# Patient Record
Sex: Female | Born: 1970 | Race: White | Hispanic: No | Marital: Married | Smoking: Never smoker
Health system: Southern US, Community
[De-identification: ages and names within clinical notes are randomized; demographics above are authoritative.]

## PROBLEM LIST (undated history)

## (undated) DIAGNOSIS — J9819 Other pulmonary collapse: Secondary | ICD-10-CM

## (undated) DIAGNOSIS — F419 Anxiety disorder, unspecified: Secondary | ICD-10-CM

## (undated) DIAGNOSIS — E559 Vitamin D deficiency, unspecified: Secondary | ICD-10-CM

## (undated) DIAGNOSIS — Z9889 Other specified postprocedural states: Secondary | ICD-10-CM

## (undated) DIAGNOSIS — G4700 Insomnia, unspecified: Secondary | ICD-10-CM

## (undated) DIAGNOSIS — Z309 Encounter for contraceptive management, unspecified: Secondary | ICD-10-CM

## (undated) DIAGNOSIS — F32A Depression, unspecified: Secondary | ICD-10-CM

## (undated) DIAGNOSIS — T4145XA Adverse effect of unspecified anesthetic, initial encounter: Secondary | ICD-10-CM

## (undated) DIAGNOSIS — Z8619 Personal history of other infectious and parasitic diseases: Secondary | ICD-10-CM

## (undated) DIAGNOSIS — M549 Dorsalgia, unspecified: Secondary | ICD-10-CM

## (undated) DIAGNOSIS — R112 Nausea with vomiting, unspecified: Secondary | ICD-10-CM

## (undated) DIAGNOSIS — G43909 Migraine, unspecified, not intractable, without status migrainosus: Secondary | ICD-10-CM

## (undated) DIAGNOSIS — G8929 Other chronic pain: Secondary | ICD-10-CM

## (undated) DIAGNOSIS — T8859XA Other complications of anesthesia, initial encounter: Secondary | ICD-10-CM

## (undated) DIAGNOSIS — F329 Major depressive disorder, single episode, unspecified: Secondary | ICD-10-CM

## (undated) DIAGNOSIS — K589 Irritable bowel syndrome without diarrhea: Secondary | ICD-10-CM

## (undated) HISTORY — PX: LUNG SURGERY: SHX703

## (undated) HISTORY — DX: Anxiety disorder, unspecified: F41.9

## (undated) HISTORY — DX: Insomnia, unspecified: G47.00

## (undated) HISTORY — DX: Migraine, unspecified, not intractable, without status migrainosus: G43.909

## (undated) HISTORY — DX: Major depressive disorder, single episode, unspecified: F32.9

## (undated) HISTORY — DX: Other chronic pain: G89.29

## (undated) HISTORY — DX: Vitamin D deficiency, unspecified: E55.9

## (undated) HISTORY — DX: Irritable bowel syndrome without diarrhea: K58.9

## (undated) HISTORY — DX: Encounter for contraceptive management, unspecified: Z30.9

## (undated) HISTORY — DX: Depression, unspecified: F32.A

## (undated) HISTORY — DX: Personal history of other infectious and parasitic diseases: Z86.19

## (undated) HISTORY — PX: BACK SURGERY: SHX140

## (undated) HISTORY — DX: Other pulmonary collapse: J98.19

## (undated) HISTORY — DX: Dorsalgia, unspecified: M54.9

---

## 2000-06-27 ENCOUNTER — Ambulatory Visit (HOSPITAL_COMMUNITY): Admission: RE | Admit: 2000-06-27 | Discharge: 2000-06-27 | Payer: Self-pay | Admitting: Neurosurgery

## 2000-06-27 ENCOUNTER — Encounter: Payer: Self-pay | Admitting: Neurosurgery

## 2000-07-02 ENCOUNTER — Ambulatory Visit (HOSPITAL_COMMUNITY): Admission: RE | Admit: 2000-07-02 | Discharge: 2000-07-02 | Payer: Self-pay | Admitting: Neurosurgery

## 2000-07-19 ENCOUNTER — Encounter: Payer: Self-pay | Admitting: Neurosurgery

## 2000-07-19 ENCOUNTER — Ambulatory Visit (HOSPITAL_COMMUNITY): Admission: RE | Admit: 2000-07-19 | Discharge: 2000-07-19 | Payer: Self-pay | Admitting: Neurosurgery

## 2000-08-02 ENCOUNTER — Ambulatory Visit (HOSPITAL_COMMUNITY): Admission: RE | Admit: 2000-08-02 | Discharge: 2000-08-02 | Payer: Self-pay | Admitting: Neurosurgery

## 2000-08-02 ENCOUNTER — Encounter: Payer: Self-pay | Admitting: Neurosurgery

## 2000-09-30 ENCOUNTER — Ambulatory Visit (HOSPITAL_COMMUNITY): Admission: RE | Admit: 2000-09-30 | Discharge: 2000-09-30 | Payer: Self-pay | Admitting: Neurosurgery

## 2000-09-30 ENCOUNTER — Encounter: Payer: Self-pay | Admitting: Neurosurgery

## 2001-08-20 ENCOUNTER — Other Ambulatory Visit: Admission: RE | Admit: 2001-08-20 | Discharge: 2001-08-20 | Payer: Self-pay | Admitting: Obstetrics and Gynecology

## 2001-08-25 ENCOUNTER — Encounter: Payer: Self-pay | Admitting: Neurosurgery

## 2001-08-25 ENCOUNTER — Encounter: Admission: RE | Admit: 2001-08-25 | Discharge: 2001-08-25 | Payer: Self-pay | Admitting: Neurosurgery

## 2001-09-08 ENCOUNTER — Encounter: Admission: RE | Admit: 2001-09-08 | Discharge: 2001-09-08 | Payer: Self-pay | Admitting: Neurosurgery

## 2001-09-08 ENCOUNTER — Encounter: Payer: Self-pay | Admitting: Neurosurgery

## 2002-03-05 ENCOUNTER — Encounter: Payer: Self-pay | Admitting: Neurosurgery

## 2002-03-06 ENCOUNTER — Encounter: Payer: Self-pay | Admitting: Neurosurgery

## 2002-03-06 ENCOUNTER — Ambulatory Visit (HOSPITAL_COMMUNITY): Admission: RE | Admit: 2002-03-06 | Discharge: 2002-03-07 | Payer: Self-pay | Admitting: Neurosurgery

## 2002-08-14 ENCOUNTER — Encounter: Payer: Self-pay | Admitting: Obstetrics and Gynecology

## 2002-08-14 ENCOUNTER — Ambulatory Visit (HOSPITAL_COMMUNITY): Admission: RE | Admit: 2002-08-14 | Discharge: 2002-08-14 | Payer: Self-pay | Admitting: Obstetrics and Gynecology

## 2002-08-25 ENCOUNTER — Ambulatory Visit (HOSPITAL_COMMUNITY): Admission: RE | Admit: 2002-08-25 | Discharge: 2002-08-25 | Payer: Self-pay | Admitting: Neurosurgery

## 2002-08-25 ENCOUNTER — Encounter: Payer: Self-pay | Admitting: Neurosurgery

## 2003-02-06 ENCOUNTER — Ambulatory Visit (HOSPITAL_COMMUNITY): Admission: AD | Admit: 2003-02-06 | Discharge: 2003-02-06 | Payer: Self-pay | Admitting: Obstetrics and Gynecology

## 2003-03-09 ENCOUNTER — Ambulatory Visit (HOSPITAL_COMMUNITY): Admission: AD | Admit: 2003-03-09 | Discharge: 2003-03-09 | Payer: Self-pay | Admitting: Obstetrics and Gynecology

## 2003-05-11 ENCOUNTER — Ambulatory Visit (HOSPITAL_COMMUNITY): Admission: AD | Admit: 2003-05-11 | Discharge: 2003-05-11 | Payer: Self-pay | Admitting: Obstetrics and Gynecology

## 2003-05-13 ENCOUNTER — Ambulatory Visit (HOSPITAL_COMMUNITY): Admission: AD | Admit: 2003-05-13 | Discharge: 2003-05-13 | Payer: Self-pay | Admitting: Obstetrics & Gynecology

## 2003-05-20 ENCOUNTER — Ambulatory Visit (HOSPITAL_COMMUNITY): Admission: AD | Admit: 2003-05-20 | Discharge: 2003-05-20 | Payer: Self-pay | Admitting: Internal Medicine

## 2003-05-25 ENCOUNTER — Inpatient Hospital Stay (HOSPITAL_COMMUNITY): Admission: AD | Admit: 2003-05-25 | Discharge: 2003-05-27 | Payer: Self-pay | Admitting: Obstetrics and Gynecology

## 2003-07-19 ENCOUNTER — Encounter: Payer: Self-pay | Admitting: Neurology

## 2003-07-19 ENCOUNTER — Encounter: Admission: RE | Admit: 2003-07-19 | Discharge: 2003-07-19 | Payer: Self-pay | Admitting: Neurology

## 2004-04-19 ENCOUNTER — Encounter: Admission: RE | Admit: 2004-04-19 | Discharge: 2004-04-19 | Payer: Self-pay | Admitting: Neurosurgery

## 2004-05-04 ENCOUNTER — Encounter: Admission: RE | Admit: 2004-05-04 | Discharge: 2004-05-04 | Payer: Self-pay | Admitting: Neurosurgery

## 2004-05-30 ENCOUNTER — Encounter: Admission: RE | Admit: 2004-05-30 | Discharge: 2004-05-30 | Payer: Self-pay | Admitting: Neurosurgery

## 2004-07-07 ENCOUNTER — Inpatient Hospital Stay (HOSPITAL_COMMUNITY): Admission: RE | Admit: 2004-07-07 | Discharge: 2004-07-09 | Payer: Self-pay | Admitting: Neurosurgery

## 2004-08-21 ENCOUNTER — Encounter: Admission: RE | Admit: 2004-08-21 | Discharge: 2004-08-21 | Payer: Self-pay | Admitting: Neurosurgery

## 2004-10-02 ENCOUNTER — Encounter: Admission: RE | Admit: 2004-10-02 | Discharge: 2004-10-02 | Payer: Self-pay | Admitting: Neurosurgery

## 2005-11-05 HISTORY — PX: CHOLECYSTECTOMY: SHX55

## 2007-12-05 ENCOUNTER — Other Ambulatory Visit: Admission: RE | Admit: 2007-12-05 | Discharge: 2007-12-05 | Payer: Self-pay | Admitting: Obstetrics and Gynecology

## 2008-11-24 ENCOUNTER — Other Ambulatory Visit: Admission: RE | Admit: 2008-11-24 | Discharge: 2008-11-24 | Payer: Self-pay | Admitting: Obstetrics and Gynecology

## 2009-08-27 ENCOUNTER — Inpatient Hospital Stay (HOSPITAL_COMMUNITY): Admission: EM | Admit: 2009-08-27 | Discharge: 2009-08-31 | Payer: Self-pay | Admitting: Emergency Medicine

## 2009-08-29 ENCOUNTER — Encounter (INDEPENDENT_AMBULATORY_CARE_PROVIDER_SITE_OTHER): Payer: Self-pay | Admitting: General Surgery

## 2009-12-15 ENCOUNTER — Other Ambulatory Visit: Admission: RE | Admit: 2009-12-15 | Discharge: 2009-12-15 | Payer: Self-pay | Admitting: Obstetrics and Gynecology

## 2010-11-25 ENCOUNTER — Encounter: Payer: Self-pay | Admitting: Obstetrics and Gynecology

## 2010-11-26 ENCOUNTER — Encounter: Payer: Self-pay | Admitting: Obstetrics & Gynecology

## 2011-02-08 LAB — HEPATIC FUNCTION PANEL
ALT: 44 U/L — ABNORMAL HIGH (ref 0–35)
AST: 44 U/L — ABNORMAL HIGH (ref 0–37)
Albumin: 3 g/dL — ABNORMAL LOW (ref 3.5–5.2)
Albumin: 3.2 g/dL — ABNORMAL LOW (ref 3.5–5.2)
Alkaline Phosphatase: 60 U/L (ref 39–117)
Bilirubin, Direct: 1.8 mg/dL — ABNORMAL HIGH (ref 0.0–0.3)
Indirect Bilirubin: 1.5 mg/dL — ABNORMAL HIGH (ref 0.3–0.9)
Total Bilirubin: 0.5 mg/dL (ref 0.3–1.2)
Total Bilirubin: 0.9 mg/dL (ref 0.3–1.2)
Total Bilirubin: 3.3 mg/dL — ABNORMAL HIGH (ref 0.3–1.2)
Total Protein: 5.5 g/dL — ABNORMAL LOW (ref 6.0–8.3)
Total Protein: 5.5 g/dL — ABNORMAL LOW (ref 6.0–8.3)

## 2011-02-08 LAB — BASIC METABOLIC PANEL
CO2: 26 mEq/L (ref 19–32)
Chloride: 110 mEq/L (ref 96–112)
Creatinine, Ser: 0.73 mg/dL (ref 0.4–1.2)
GFR calc Af Amer: >60 mL/min (ref >60)
Glucose, Bld: 108 mg/dL — ABNORMAL HIGH (ref 70–99)
Sodium: 140 mEq/L (ref 135–145)

## 2011-02-08 LAB — COMPREHENSIVE METABOLIC PANEL
ALT: 18 U/L (ref 0–35)
AST: 29 U/L (ref 0–37)
Alkaline Phosphatase: 53 U/L (ref 39–117)
BUN: 10 mg/dL (ref 6–23)
GFR calc Af Amer: >60 mL/min (ref >60)
Total Bilirubin: 0.5 mg/dL (ref 0.3–1.2)

## 2011-02-08 LAB — POCT CARDIAC MARKERS: Troponin i, poc: <0.05 ng/mL (ref 0.00–0.09)

## 2011-02-08 LAB — DIFFERENTIAL
Basophils Absolute: 0 10*3/uL (ref 0.0–0.1)
Basophils Absolute: 0 10*3/uL (ref 0.0–0.1)
Basophils Relative: 0 % (ref 0–1)
Basophils Relative: 0 % (ref 0–1)
Basophils Relative: 0 % (ref 0–1)
Eosinophils Absolute: 0 10*3/uL (ref 0.0–0.7)
Eosinophils Absolute: 0.1 10*3/uL (ref 0.0–0.7)
Eosinophils Absolute: 0.1 10*3/uL (ref 0.0–0.7)
Eosinophils Relative: 0 % (ref 0–5)
Eosinophils Relative: 1 % (ref 0–5)
Lymphocytes Relative: 11 % — ABNORMAL LOW (ref 12–46)
Lymphocytes Relative: 31 % (ref 12–46)
Lymphs Abs: 2.1 10*3/uL (ref 0.7–4.0)
Monocytes Absolute: 0.6 10*3/uL (ref 0.1–1.0)
Monocytes Absolute: 0.6 10*3/uL (ref 0.1–1.0)
Monocytes Relative: 3 % (ref 3–12)
Monocytes Relative: 6 % (ref 3–12)
Neutro Abs: 16 10*3/uL — ABNORMAL HIGH (ref 1.7–7.7)
Neutrophils Relative %: 85 % — ABNORMAL HIGH (ref 43–77)

## 2011-02-08 LAB — CBC
HCT: 35.7 % — ABNORMAL LOW (ref 36.0–46.0)
HCT: 35.8 % — ABNORMAL LOW (ref 36.0–46.0)
Hemoglobin: 11.9 g/dL — ABNORMAL LOW (ref 12.0–15.0)
Hemoglobin: 12 g/dL (ref 12.0–15.0)
MCHC: 33.6 g/dL (ref 30.0–36.0)
MCHC: 34 g/dL (ref 30.0–36.0)
MCV: 91.8 fL (ref 78.0–100.0)
MCV: 92.4 fL (ref 78.0–100.0)
MCV: 93.2 fL (ref 78.0–100.0)
Platelets: 224 10*3/uL (ref 150–400)
Platelets: 232 10*3/uL (ref 150–400)
RBC: 3.8 MIL/uL — ABNORMAL LOW (ref 3.87–5.11)
RDW: 13 % (ref 11.5–15.5)

## 2011-02-08 LAB — URINE MICROSCOPIC-ADD ON

## 2011-02-08 LAB — LIPASE, BLOOD
Lipase: 33 U/L (ref 11–59)
Lipase: 36 U/L (ref 11–59)

## 2011-02-08 LAB — URINALYSIS, ROUTINE W REFLEX MICROSCOPIC
Bilirubin Urine: NEGATIVE
Ketones, ur: NEGATIVE mg/dL
Leukocytes, UA: NEGATIVE
Nitrite: NEGATIVE
Protein, ur: NEGATIVE mg/dL
Urobilinogen, UA: 0.2 mg/dL (ref 0.0–1.0)

## 2011-02-08 LAB — PREGNANCY, URINE: Preg Test, Ur: NEGATIVE

## 2011-03-23 NOTE — H&P (Signed)
[place]. [facility]  Patient:    [name], [name] Visit Number: [id number] MRN: [id number]          Service Type: DSU Location: 3000 [id number] [id number] Attending Physician:  Coletta Memos Dictated by:   Mena Goes. Franky Macho, M.D. Admit Date:  03/06/2002                           History and Physical  ADMITTING DIAGNOSES: 1. 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 2. Right L5 radiculopathy.  INDICATIONS:  [name] is a patient whom I have followed for a number of years.  I have been following her for what was a small disk at L4-5 on the right side which I never felt was big enough to justify doing a procedure.  I first saw her in May of 1999 and she had had back and right lower extremity pain at that time.  During the ensuing years, she has been treated with oral medications, conservative treatment, epidural steroids.  She has been able to return to work at times and at other times, she has had to come off of work. 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 The last time that I had seen [name] was on August 15th of 2001. [name] then was put on a p.r.n. basis and was doing well, until she called in late April.  She stated that she had severe pain in her back and right lower extremity which was different than it had been previously.  The pain was located on the right side, as it had been before, but its character and severity were different.  PAST MEDICAL HISTORY:  Excellent.  She denied constitutional, ear, nose, throat, cardiovascular, respiratory, genitourinary, skin, neurological, psychiatric, endocrinological, hematologic or allergic problems.  She has had pneumothoraces, which were spontaneous, in 1991, 1992 and 1996.  ALLERGIES:  She had intolerance to CODEINE.  SOCIAL HISTORY:  She does not smoke.  PHYSICAL EXAMINATION:  GENERAL:  [name], on examination, was alert and oriented x4 and answering all questions  appropriately, mild distress.  NEUROLOGIC:  Positive straight leg raising on the right.  Deep tendon reflexes 2+ at the knees and ankles and no clonus.  She was able to toe walk and heel walk.  Pupils are equal, round and reactive to light.  Full extraocular movements.  Tongue and uvula midline.  Shoulder shrug normal.  She had symmetric facial movement, symmetric facial sensation.  Hearing intact to finger rub bilaterally.  She has no drift on exam.  Gait was mildly antalgic and favoring the right lower extremity.  NECK:  No cervical masses or bruits.  LUNGS:  Clear.  HEART:  Regular rhythm and rate.  No murmurs or rubs.  LABORATORY AND ACCESSORY DATA:  MRI shows a displaced disk at L4-5 which had enlarged since the last time it had been imaged.  ASSESSMENT AND PLAN:  I recommended to [name] that she undergo operative decompression, since she had been living with the pain for so long, had had the disk previously and it was degenerated, so there was no real reason for her not to simply undergo the operation as conservative treatment certainly had been given more than enough time. Dictated by:   Mena Goes. Franky Macho, M.D. Attending Physician:  Coletta Memos DD:  03/06/02 TD:  03/07/02 Job: [id number]

## 2011-03-23 NOTE — Discharge Summary (Signed)
NAME:  [name], [name]                      ACCOUNT NO.:  [id number]   MEDICAL RECORD NO.:  [id number]                   PATIENT TYPE:  INP   LOCATION:  A417                                 FACILITY:  [facility]   PHYSICIAN:  Tilda Burrow, M.D.              DATE OF BIRTH:  Jan 21, 1971   DATE OF ADMISSION:  05/25/2003  DATE OF DISCHARGE:  05/27/2003                                 DISCHARGE SUMMARY   ADMISSION DIAGNOSES:  1. Pregnancy, [redacted] week gestation.  2. Elective induction due to extreme pelvic discomfort and patient anxiety.   DISCHARGE DIAGNOSES:  1. Pregnancy, [redacted] weeks gestation, delivered.  2. Elective induction.  3. Transient tachypnea of the newborn, dictated elsewhere.   PROCEDURE:  1. Foley bulb cervical ripening on May 25, 2003.  2. Spontaneous vertex vaginal delivery on May 26, 2003 by Zerita Boers.   DISCHARGE MEDICATIONS:  1. Prenatal vitamins.  2. Iron.  3. Xanax.  4. Wellbutrin.  5. Tylox.   HOSPITAL COURSE:  This is a 40 year old female who is a Gravida II, Para 1  at 38 weeks by consensus criteria who was admitted for elective induction as  per patient's vigorous wishes.  A lengthy discussion of the pros and cons of  induction had been discussed and were specifically acknowledged by a small  (1 to 2%) potential for issues of prematurity due to the induction at 38  weeks.  The patient had been offered induction the next Sunday night and  Monday and had this recommended as a prior option, but felt she could no  longer endure subsequent pregnancy and was worried about the baby.   Her prenatal course had been notable for a relatively straightforward course  with marked patient discomfort over the past month particularly.  The cervix  was 3 cm, 90% effaced and +1 station for quite some time.   The patient was admitted and cervical balloon placed and the patient  responded nicely following which she was begun on Pitocin and amniotomy  performed. She  progressed nicely to completely dilated and delivered under  the delivery of Zerita Boers, CNM.  Delivery was uneventful, but the baby  promptly showed some respiratory difficulties.  Apgars assigned were 7 and 8  with the infant showing a spontaneous cry and with the appropriate  suctioning performed at delivery.  The infant's color did not improve as  much as expected and remained cyanotic despite stimulation and otherwise  thorough nursing and physician care. The baby was transferred to the nursery  for further monitoring and oxygen care.  The patient's course was uneventful  except as pertaining to the infant.  The infant showed a definite need for  additional care. A chest x-ray showed relatively clear lung fields.  The  infant was transferred out of concern for its respiratory effort to [facility].  See copies of records from that hospital for further details.  The infant's  condition at the time of this discharge summary dictation per  verbal reports indicate that the baby has done well. The baby was discharged  in 48 hours.   Pathology report returned showing a 485 gram uterus, considered mature by  pathology criteria.  Postoperatively the patient has a hemoglobin of 9.7 and  a hematocrit of 29.2 one day postpartum.  The patient was then discharged  home for follow-up through our office as needed and then at four weeks  postpartum for consideration of permanent sterilization.   Addendum:  The patient is encouraging partner to have a vasectomy as her  preferred long term contraception.                                               Tilda Burrow, M.D.    JVF/MEDQ  D:  06/06/2003  T:  06/07/2003  Job:  [id number]

## 2011-03-23 NOTE — Op Note (Signed)
   NAME:  [name], [name]                      ACCOUNT NO.:  [id number]   MEDICAL RECORD NO.:  [id number]                   PATIENT TYPE:  INP   LOCATION:  A417                                 FACILITY:  [facility]   PHYSICIAN:  Tilda Burrow, M.D.              DATE OF BIRTH:  04/12/1971   DATE OF PROCEDURE:  05/26/2003  DATE OF DISCHARGE:                                  PROCEDURE NOTE   PROCEDURE:   SURGEON:  Tilda Burrow, M.D.   DETAILS OF PROCEDURE:  The patient was placed in sitting position after exam  showed her to be 7 cm dilated with cervix dense tightly applied to the head.  There is quite a bit of progress to be made because of the firm nature of  the tissue. A continuous lumbar epidural catheter was placed using loss of  resistance technique at L2-3 interspace.  We moved up from L3-4 due to bony  fibrosis encountered at L3-4. She has had an L4-5 fusion so lower spaces  were technically challenging.   The patient had easy identification of the epidural space at L2-3 on first  attempt with the 19-gauge Touhy needle using loss of resistance technique.  Aspiration showed no evidence of intrathecal position.  The 5 cc bolus of  1.5% Xylocaine with epinephrine was infused, then epidural catheter,  followed by once again aspiration test and then bolus of 5 cc of 0.125%  Marcaine.  We then proceed with 8 cc/hour of continuous infusion.  The  patient had satisfactory analgesic relief.  Vaginal delivery is anticipated.                                               Tilda Burrow, M.D.    JVF/MEDQ  D:  05/26/2003  T:  05/26/2003  Job:  [id number]

## 2011-03-23 NOTE — H&P (Signed)
NAME:  [name], [name]                      ACCOUNT NO.:  [id number]   MEDICAL RECORD NO.:  [id number]                   PATIENT TYPE:  INP   LOCATION:  LDR4                                 FACILITY:  [facility]   PHYSICIAN:  Tilda Burrow, M.D.              DATE OF BIRTH:  April 13, 1971   DATE OF ADMISSION:  05/25/2003  DATE OF DISCHARGE:                                HISTORY & PHYSICAL   ADMISSION DIAGNOSES:  1. Pregnancy at [redacted] weeks gestation.  2. Elective induction due to extreme pelvic discomfort and patient anxiety.   HISTORY OF PRESENT ILLNESS:  This 40 year old female, gravida 2, para 1, AB  0, LMP ? October 2003, placing unreliable menstrual EDC with ultrasound  assigning EDC of 06/08/2003 based on 20-week ultrasound.  The patient is 38  weeks by that criteria.  First trimester ultrasound at 7 weeks suggested  06/17/2003 EDC.  A 30-week ultrasound showed 06/13/2003.  The patient has had  an extremely miserable last month of pregnancy, has had marked cervical  favorability, and very low presenting part over the last month.  Cervical  exam suggested a possible 1 station over the last four exams.  Cervix was 3  cm, 90%, +1 by 05/19/2003 exam.  My exam shows her to be 3 cm, 50%, 0 to +1  station presenting part with a posterior oriented cervix on 05/24/2003.  Lengthy discussion of pros and cons of induction has been conducted with the  patient, specifically acknowledging the small (1 to 2%) but unavoidable  potential for issues of prematurity due to the induction at 38 weeks.  The  patient has been offered induction next Sunday night and Monday and declines  this offer.  The patient has had thorough counseling from multiple family  members and staff regarding this issues.   PAST MEDICAL HISTORY:  Bulging disk times many years.   PAST SURGICAL HISTORY:  1. Back surgery L4-5 fusion.  2. Chest tubes placed due to collapsed lungs.   ALLERGIES:  CODEINE.   MEDICATIONS:  Wellbutrin  and Tylox during early pregnancy, discontinued, and  Prozac has been continued since that time due to patient's anxiety disorder.   SOCIAL HISTORY:  Married, unemployed.   PHYSICAL EXAMINATION:  GENERAL:  Healthy, slim, Caucasian female, alert and  oriented x 3.  HEENT:  Pupils are equal, round, and reactive.  Extraocular movements  intact.  NECK:  Supple. Trachea midline.  CARDIOVASCULAR:  Exam unremarkable.  PELVIC:  Cervix 7 cm, 90%, 0 to 1+ station.    PRENATAL LABORATORY DATA:  Blood type O negative, rubella immune at the  present.  Hemoglobin 13, hematocrit 39.  Hepatitis, HIV, GC, Chlamydia all  negative.  Group B strep negative.   PLAN:  Continue induction of labor.  Good prognosis for vaginal delivery.  Tilda Burrow, M.D.    JVF/MEDQ  D:  05/26/2003  T:  05/26/2003  Job:  [id number]   cc:   Dr. Alphia Moh pediatrician

## 2011-03-23 NOTE — Op Note (Signed)
[place]. [facility]  Patient:    [name], [name] Visit Number: [id number] MRN: [id number]          Service Type: DSU Location: 3000 3041 01 Attending Physician:  Coletta Memos Dictated by:   Mena Goes. Franky Macho, M.D. Proc. Date: 03/06/02 Admit Date:  03/06/2002 Discharge Date: 03/07/2002                             Operative Report  PREOPERATIVE DIAGNOSES: 1. 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 2. Right L5 radiculopathy.  POSTOPERATIVE DIAGNOSES: 1. 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 2. Right L5 radiculopathy.  PROCEDURE:  Right L4 semihemilaminectomy with diskectomy, with microdissection.  COMPLICATIONS:  None.  SURGEON: Kyle L. Franky Macho, M.D.  ANESTHESIA:  General endotracheal.  INDICATION:  [name] is a woman who presents with severe pain in the right lower extremity and a displaced disk on the right side at L4-5.  With conservative treatment not helping, I recommended and she agreed to undergo laminectomy and diskectomy.  DESCRIPTION OF PROCEDURE:  [name] was brought to the operating room, intubated, and placed under general anesthesia without difficulty.  She was rolled prone onto the Wilson frame, ensuring all pressure points were properly padded.  A spinal needle was placed for localization, and it was in between the spinous processes of L4 and L5.  Using that as a guide, the patient was then prepped.  I then infiltrated 0.5% lidocaine and 1:200,000 epinephrine 10 cc into the paraspinous musculature on the left side.  I opened the incision with a #10 blade and took this down to the thoracolumbar fascia sharply.  I made a semicircular opening in the thoracolumbar fascia and in a subperiosteal fashion exposed the laminae of what I believed to be L4 and L5. I took another x-ray after placing a double-ended ganglion knife inferior to the superior lamina, which was L4.  Once identifying L4, I then proceeded with a semihemilaminectomy using a  high-speed air drill on L4.  I was able to then detach the ligamentum flavum from L4.  I brought the microscope into the operative field and removed the ligamentum flavum in a rostral to caudal direction, removing the thecal sac.  I controlled some epidural bleeding with bipolar cautery and division of the veins.  I retracted that medially and identified what was a rather large disk herniation.  I opened that with a #15 blade, and disk material extruded under its own pressure.  I then removed this using pituitary rongeurs.  I removed the loose material which I was able to get from the disk space.  I then irrigated the wound.  I inspected, and there was no pressure on the L5 nerve root.  I then irrigated again and then closed the wound in layered fashion using Vicryl sutures to reapproximate the fascial flap and subcutaneous tissues.  Dermabond used for a sterile dressing. Dictated by:   Mena Goes. Franky Macho, M.D. Attending Physician:  Coletta Memos DD:  03/06/02 TD:  03/09/02 Job: [id number]

## 2011-03-23 NOTE — Op Note (Signed)
   NAME:  [name], [name]                      ACCOUNT NO.:  [id number]   MEDICAL RECORD NO.:  [id number]                   PATIENT TYPE:  INP   LOCATION:  LDR4                                 FACILITY:  [facility]   PHYSICIAN:  Tilda Burrow, M.D.              DATE OF BIRTH:  11/18/1970   DATE OF PROCEDURE:  05/26/2003  DATE OF DISCHARGE:                                  PROCEDURE NOTE   DELIVERY SUMMARY:  Onset of labor is May 26, 2003, at 8 a.m.  Date of delivery:  May 26, 2003, at 9:12 a.m.  Length of first stage labor:  One hour.  Length of second stage labor:  12 minutes.  Length of third stage labor:  8 minutes.   DELIVERY NOTE:  [name] had a spontaneous delivery of a viable female  infant.  Upon delivery of infant, sterile suction was done.  Good tone.  Difficulty to get the infant pinking up.  The infant had grunting upon  birth.  The infant was dried and placed in the care of the nurses, who  proceeded to administer blow-by to the infant.  Apgars were 8 and 8.  Upon  inspection there was a second degree perineal laceration, which was repaired  with three interrupted sutures of 2-0 Vicryl.  Third stage of labor was  actively managed with 20 units of Pitocin in 1000 mL of D5LR at a rapid  rate.  The placenta was delivered spontaneously via Tomasa Blase mechanism.  Cord  gas and cord blood were obtained and sent to the lab.  Estimated blood loss  was approximately 350 mL.  The infant was taken to the special care nursery  and Francoise Schaumann. Halm, D.O., notified to administer care.  Mother was  stabilized and transferred out to the postpartum unit.  The infant was taken  to the special care nursery for attendance.     Zerita Boers, Reita Cliche, M.D.    DL/MEDQ  D:  16/08/9603  T:  05/26/2003  Job:  [id number]

## 2011-03-23 NOTE — H&P (Signed)
NAME:  [name], [name]                      ACCOUNT NO.:  [id number]   MEDICAL RECORD NO.:  [id number]                    PATIENT TYPE:   LOCATION:                                       FACILITY:   PHYSICIAN:  Tilda Burrow, M.D.              DATE OF BIRTH:   DATE OF ADMISSION:  05/25/2003  DATE OF DISCHARGE:                                HISTORY & PHYSICAL   ADMISSION DIAGNOSIS:  Pregnancy at 59 weeks' gestation, elective induction  of labor.   HISTORY OF PRESENT ILLNESS:  This 40 year old female, gravida 2, para 1, AB  0, LMP ? with early ultrasound-assigned [facility] of June 08, 2003, was admitted  at 38 weeks by that ultrasound criteria after a pregnancy course followed  through our office and notable over the past six weeks for recurrent  episodes of preterm uterine irritability and tons of pressure symptoms over  the past few weeks.  Clancy has been requesting induction for a couple of  this weeks.  At this point we have given her the option of induction next  Monday or Wednesday, May 26, 2003, with a lengthy 20-minute discussion of  the pros and cons of induction, with specific emphatic emphasis that she  acknowledge that there is some slight inevitable risk of problems with any  induction, particularly at 38 weeks.  The patient understands the potential  for the baby delivered without complications is quite high; however, there  is some inherent small, less than 2% risk of complications such as need for  emergency delivery or lung prematurity that are unavoidable with induction.  The patient has thought it over, discussed with family, and after a time  accepts this and after all this informed information requests induction on  May 26, 2003.  She will be admitted on July 20 for cervical ripening.   PAST MEDICAL HISTORY:  A bulging disk in the back, history of collapsed  lung.   PAST SURGICAL HISTORY:  Back surgery in the past.   ALLERGIES:  CODEINE.   MEDICATIONS:  Wellbutrin, Prozac.   SOCIAL HISTORY:  Married, unemployed.   FAMILY HISTORY:  Strong family history of anxiety disorder.   PHYSICAL EXAMINATION:  GENERAL:  A healthy Caucasian female, alert and  oriented x3.  HEENT:  Pupils equal, round, and reactive, extraocular movements intact.  NECK:  Supple.  Trachea midline.  CHEST:  Clear to auscultation.  ABDOMEN:  Nontender.  Fundal height 34 cm, vertex having dropped a lot.  The  fundal height has remained slowly progressive throughout the pregnancy,  stabilizing at 34 cm for the last three visits.  PELVIC:  Cervix is 3 cm, 50%, 0 station, and quite posterior.  Vertex was  well-applied.    PLAN:  Admit overnight.  Will attempt balloon dilation if possible.  If  cervical access is impossible, will use Cytotec cervical ripening.  Good  prognosis for vaginal delivery.  The patient  will request epidural.  Will  bottle-feed and plans vasectomy for future contraception.                                               Tilda Burrow, M.D.    JVF/MEDQ  D:  05/24/2003  T:  05/24/2003  Job:  [id number]

## 2011-03-23 NOTE — Op Note (Signed)
NAME:  [name], [name]                      ACCOUNT NO.:  [id number]   MEDICAL RECORD NO.:  [id number]                   PATIENT TYPE:  INP   LOCATION:  NA                                   FACILITY:  [facility]   PHYSICIAN:  Reinaldo Meeker, M.D.              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   PROCEDURE:  Right L4-5 microdiskectomy, followed by right L4-5 transverse  lumbar interbody fusion with bony spacer followed by pedicle screw fixation;  and then followed by a posterolateral fusion.   SECONDARY PROCEDURE:  Microdissection L4-5 disk and L4 and L5 nerve roots,  as well as intraoperative EMG monitoring.   SURGEON:  Reinaldo Meeker, M.D.   ASSISTANT:  Tia Alert, MD   PROCEDURE IN DETAIL:  After placement in the prone position, the patient's  back was prepped and draped in the usual sterile fashion.  Localizing x-ray  was taken prior to incision in order to identify the appropriate level.  Needle identifiers placed at middle of spinous processes of L3, L4 and L5.  Using Bovie cautery current, the incision was carried down the spinous  processes.  Subperiosteal dissection was then carried out bilaterally and  along the spinous processes of the lamina and facet joints, and a bit  further lateral along the patient's right common symptomatic side.  Self-  retaining retractor was placed for exposure and x-ray showed approach at the  appropriate level.   The spinous processes of L4 and L5, along with the intraspinous ligament  were removed.  On the patient's right side a generous laminotomy was  performed by removing the inferior three-quarters of the L4 lamina, the  medial three-quarters of the facet joint and the superior one-half of the L5  lamina.  Residual bone was removed and used later in the case.  The ligament  of Flavum was removed in a piecemeal fashion.  L4 and L5 nerve roots were  easily identified coming around their respective pedicles.   At this time, microdiskectomy was carried out.  Annulus was coagulated with  a #15 blade.  Using pituitary rongeurs and curets, the remaining disk  material cleaning out was carried out.  This time great care was taken to  avoid injury to the neural elements; this was successfully done.  Inspection  was carried out in all directions for any evidence of residual compression;  none could be identified.   A 7 mm distractor was placed within the disk space and found to be a good  fit.  It was elected to pursue with a 10 mm T lift.  The disk space was then  prepared for interbody fusion, prior to placing the bony spacer.  Autologous  bone graft was placed deep within the disk space to help with the interbody  fusion.  Bony spacer was then placed without difficulty and fluoroscopy  showed it to be in good position.  Pedicle screw fixation was then carried  out on the patient's right side.  Pedicle probe was used, followed by a  tapping and placing of 6.5 x 40 mm screws at both L4 and L5.  This was  followed under fluoroscopy and direct palpation.   Appropriate length rod was then chosen, and top-loading nuts were secured.  Top nut was then locked into place and prior to locking the second nut,  compression was carried out. __________ head screw was then placed on the  patient's left side.  Drawover guide wire was passed through the inferior  facet of L4 and into the pedicle of L5.  This was then tapped after  intraoperative NG monitoring showed no evidence of breach of the pedicle.  A  25 mm screw was then placed without difficulty, and fluoroscopy showed it to  be in good position.   At this point, large amounts of irrigation were carried out.  Final  fluoroscopy and revision  testing showed no evidence of breach in the  pedicle, and there was good placement of the screws, rod and plug.  High-  speed drill was used to decorticate the lamina and facet joints of the  patient's left side at L4-5, and autologous bone graft was placed for the  posterolateral fusion.  Gelfoam was then placed over the exposed dura.  The  wound was closed using intraoperative Vicryl in the muscle, fascia and  subcutaneous tissues.  Staples on the skin.  A sterile dressing was then  applied and the patient was extubated and taken to recovery room in stable  condition.                                               Reinaldo Meeker, M.D.    ROK/MEDQ  D:  07/07/2004  T:  07/08/2004  Job:  [id number]

## 2011-04-25 ENCOUNTER — Other Ambulatory Visit (HOSPITAL_COMMUNITY)
Admission: RE | Admit: 2011-04-25 | Discharge: 2011-04-25 | Disposition: A | Discharge status: Home / Self Care | Payer: BC Managed Care – PPO | Source: Ambulatory Visit | Attending: Obstetrics and Gynecology | Admitting: Obstetrics and Gynecology

## 2011-04-25 ENCOUNTER — Other Ambulatory Visit: Payer: Self-pay | Admitting: Adult Health

## 2011-04-25 DIAGNOSIS — Z01419 Encounter for gynecological examination (general) (routine) without abnormal findings: Secondary | ICD-10-CM | POA: Insufficient documentation

## 2012-07-15 ENCOUNTER — Other Ambulatory Visit (HOSPITAL_COMMUNITY)
Admission: RE | Admit: 2012-07-15 | Discharge: 2012-07-15 | Disposition: A | Discharge status: Home / Self Care | Payer: BC Managed Care – PPO | Source: Ambulatory Visit | Attending: Obstetrics and Gynecology | Admitting: Obstetrics and Gynecology

## 2012-07-15 ENCOUNTER — Other Ambulatory Visit: Payer: Self-pay | Admitting: Adult Health

## 2012-07-15 DIAGNOSIS — Z01419 Encounter for gynecological examination (general) (routine) without abnormal findings: Secondary | ICD-10-CM | POA: Insufficient documentation

## 2012-07-15 DIAGNOSIS — Z1151 Encounter for screening for human papillomavirus (HPV): Secondary | ICD-10-CM | POA: Insufficient documentation

## 2012-07-15 LAB — CBC
HCT: 37 %
Hemoglobin: 12.8 g/dL (ref 12.0–16.0)
T4, Total: 7.7
WBC: 7.5
platelet count: 288

## 2012-07-15 LAB — COMPREHENSIVE METABOLIC PANEL
BUN: 5 mg/dL (ref 4–21)
CO2: 28 mmol/L
Glucose: 77
Hgb A1c MFr Bld: 5.6 % (ref 4.0–6.0)
Sodium: 141 mmol/L (ref 137–147)
Total Bilirubin: 0.3 mg/dL
Total Protein: 6.3 g/dL

## 2012-09-01 ENCOUNTER — Ambulatory Visit (INDEPENDENT_AMBULATORY_CARE_PROVIDER_SITE_OTHER): Payer: BC Managed Care – PPO | Admitting: Urgent Care

## 2012-09-01 ENCOUNTER — Encounter: Payer: Self-pay | Admitting: Urgent Care

## 2012-09-01 VITALS — BP 91/60 | HR 75 | Temp 98.4°F | Ht 68.0 in | Wt 124.6 lb

## 2012-09-01 DIAGNOSIS — R630 Anorexia: Secondary | ICD-10-CM

## 2012-09-01 DIAGNOSIS — K529 Noninfective gastroenteritis and colitis, unspecified: Secondary | ICD-10-CM | POA: Insufficient documentation

## 2012-09-01 DIAGNOSIS — R634 Abnormal weight loss: Secondary | ICD-10-CM

## 2012-09-01 DIAGNOSIS — R197 Diarrhea, unspecified: Secondary | ICD-10-CM

## 2012-09-01 DIAGNOSIS — R110 Nausea: Secondary | ICD-10-CM

## 2012-09-01 DIAGNOSIS — R1310 Dysphagia, unspecified: Secondary | ICD-10-CM

## 2012-09-01 MED ORDER — PEG 3350-KCL-NA BICARB-NACL 420 G PO SOLR: 4000.0000 mL | ORAL | Status: DC

## 2012-09-01 MED ORDER — PANTOPRAZOLE SODIUM 40 MG PO TBEC: 40.0000 mg | DELAYED_RELEASE_TABLET | Freq: Every day | ORAL | Status: DC

## 2012-09-01 NOTE — Assessment & Plan Note (Signed)
Chronic diarrhea has improved with bentyl prn.  See unintentional weight loss.  Continue bentyl 20mg  BID prn diarrhra Hold bentyl day of prep If diarrhea returns,pt instructed to call us for a full set of stool studies

## 2012-09-01 NOTE — Assessment & Plan Note (Addendum)
[name] is a pleasant 41 y.o. female with unintentional weight loss of 20# in past 5 months who also has anorexia, nausea, malaise, & chronic diarrhea.  She will need further evaluation with colonoscopy & EGD with Dr Darrick Penna to determine etiology.  Differentials are wide and include malignancy, PUD, h pylori, celiac disease, IBD or non-GI source.  I have discussed risks & benefits which include, but are not limited to, bleeding, infection, perforation & drug reaction.  The patient agrees with this plan & written consent will be obtained.

## 2012-09-01 NOTE — Assessment & Plan Note (Signed)
See unintentional weight loss

## 2012-09-01 NOTE — Progress Notes (Signed)
Faxed to PCP
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
## 2012-09-01 NOTE — Assessment & Plan Note (Signed)
Intermittent solid-food dysphagia.  Will need EGD with possible esophageal dilation with Dr. Darrick Penna to look for esophageal web, ring or stricture.

## 2012-09-01 NOTE — Assessment & Plan Note (Signed)
Chronic nausea without emesis, anorexia.  See unintentional weight loss.  Continue zofran PRN Add pantoprazole 40 mg daily

## 2012-09-01 NOTE — Patient Instructions (Addendum)
You need a colonoscopy & EGD (upper endoscopy) with Dr Darrick Penna Do not take bentyl day before colonoscopy If diarrhea returns, please call us as you will need stool studies

## 2012-09-05 DIAGNOSIS — K589 Irritable bowel syndrome without diarrhea: Secondary | ICD-10-CM

## 2012-09-05 HISTORY — DX: Irritable bowel syndrome, unspecified: K58.9

## 2012-09-08 ENCOUNTER — Encounter (HOSPITAL_COMMUNITY): Payer: Self-pay | Admitting: Pharmacy Technician

## 2012-09-15 ENCOUNTER — Encounter (HOSPITAL_COMMUNITY)
Admission: RE | Disposition: A | Discharge status: Home / Self Care | Payer: Self-pay | Source: Ambulatory Visit | Attending: Gastroenterology

## 2012-09-15 ENCOUNTER — Encounter (HOSPITAL_COMMUNITY): Payer: Self-pay

## 2012-09-15 ENCOUNTER — Ambulatory Visit (HOSPITAL_COMMUNITY)
Admission: RE | Admit: 2012-09-15 | Discharge: 2012-09-15 | Disposition: A | Discharge status: Home / Self Care | Payer: BC Managed Care – PPO | Source: Ambulatory Visit | Attending: Gastroenterology | Admitting: Gastroenterology

## 2012-09-15 DIAGNOSIS — R634 Abnormal weight loss: Secondary | ICD-10-CM

## 2012-09-15 DIAGNOSIS — R197 Diarrhea, unspecified: Secondary | ICD-10-CM

## 2012-09-15 DIAGNOSIS — R1310 Dysphagia, unspecified: Secondary | ICD-10-CM

## 2012-09-15 DIAGNOSIS — K2940 Chronic atrophic gastritis without bleeding: Secondary | ICD-10-CM | POA: Insufficient documentation

## 2012-09-15 DIAGNOSIS — R630 Anorexia: Secondary | ICD-10-CM

## 2012-09-15 DIAGNOSIS — K529 Noninfective gastroenteritis and colitis, unspecified: Secondary | ICD-10-CM

## 2012-09-15 DIAGNOSIS — K5730 Diverticulosis of large intestine without perforation or abscess without bleeding: Secondary | ICD-10-CM

## 2012-09-15 DIAGNOSIS — K3189 Other diseases of stomach and duodenum: Secondary | ICD-10-CM | POA: Insufficient documentation

## 2012-09-15 DIAGNOSIS — K2991 Gastroduodenitis, unspecified, with bleeding: Secondary | ICD-10-CM

## 2012-09-15 DIAGNOSIS — K2971 Gastritis, unspecified, with bleeding: Secondary | ICD-10-CM

## 2012-09-15 DIAGNOSIS — R109 Unspecified abdominal pain: Secondary | ICD-10-CM

## 2012-09-15 DIAGNOSIS — R110 Nausea: Secondary | ICD-10-CM

## 2012-09-15 HISTORY — DX: Other complications of anesthesia, initial encounter: T88.59XA

## 2012-09-15 HISTORY — DX: Nausea with vomiting, unspecified: R11.2

## 2012-09-15 HISTORY — PX: COLONOSCOPY WITH ESOPHAGOGASTRODUODENOSCOPY (EGD): SHX5779

## 2012-09-15 HISTORY — DX: Other specified postprocedural states: Z98.890

## 2012-09-15 HISTORY — DX: Adverse effect of unspecified anesthetic, initial encounter: T41.45XA

## 2012-09-15 SURGERY — COLONOSCOPY WITH ESOPHAGOGASTRODUODENOSCOPY (EGD)
Anesthesia: Moderate Sedation

## 2012-09-15 MED ORDER — MINERAL OIL PO OIL
TOPICAL_OIL | ORAL | Status: AC
  Filled 2012-09-15: qty 30

## 2012-09-15 MED ORDER — PROMETHAZINE HCL 25 MG/ML IJ SOLN
INTRAMUSCULAR | Status: AC
  Filled 2012-09-15: qty 1

## 2012-09-15 MED ORDER — MEPERIDINE HCL 100 MG/ML IJ SOLN
INTRAMUSCULAR | Status: DC | PRN
  Administered 2012-09-15 (×2): 25 mg via INTRAVENOUS
  Administered 2012-09-15: 50 mg via INTRAVENOUS

## 2012-09-15 MED ORDER — PROMETHAZINE HCL 25 MG/ML IJ SOLN
INTRAMUSCULAR | Status: DC | PRN
  Administered 2012-09-15: 12.5 mg via INTRAVENOUS

## 2012-09-15 MED ORDER — MIDAZOLAM HCL 5 MG/5ML IJ SOLN
INTRAMUSCULAR | Status: DC | PRN
  Administered 2012-09-15 (×3): 2 mg via INTRAVENOUS

## 2012-09-15 MED ORDER — DICYCLOMINE HCL 20 MG PO TABS: ORAL_TABLET | ORAL | Status: DC

## 2012-09-15 MED ORDER — BUTAMBEN-TETRACAINE-BENZOCAINE 2-2-14 % EX AERO
INHALATION_SPRAY | CUTANEOUS | Status: DC | PRN
  Administered 2012-09-15: 2 via TOPICAL

## 2012-09-15 MED ORDER — MEPERIDINE HCL 100 MG/ML IJ SOLN
INTRAMUSCULAR | Status: AC
  Filled 2012-09-15: qty 1

## 2012-09-15 MED ORDER — SODIUM CHLORIDE 0.9 % IJ SOLN
INTRAMUSCULAR | Status: AC
  Filled 2012-09-15: qty 10

## 2012-09-15 MED ORDER — PROMETHAZINE HCL 25 MG/ML IJ SOLN
12.5000 mg | Freq: Four times a day (QID) | INTRAMUSCULAR | Status: DC | PRN
  Administered 2012-09-15: 12.5 mg via INTRAVENOUS

## 2012-09-15 MED ORDER — SODIUM CHLORIDE 0.45 % IV SOLN
INTRAVENOUS | Status: DC
  Administered 2012-09-15: 08:00:00 via INTRAVENOUS

## 2012-09-15 MED ORDER — STERILE WATER FOR IRRIGATION IR SOLN
Status: DC | PRN
  Administered 2012-09-15: 09:00:00

## 2012-09-15 MED ORDER — MIDAZOLAM HCL 5 MG/5ML IJ SOLN
INTRAMUSCULAR | Status: AC
  Filled 2012-09-15: qty 10
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
## 2012-09-15 NOTE — Op Note (Addendum)
[facility] [address] [place], [address]   COLONOSCOPY PROCEDURE REPORT  PATIENT: [name], [name]  MR#: [id number] BIRTHDATE: 1971/10/07 , 41  yrs. old GENDER: Female ENDOSCOPIST: Jonette Eva, MD REFERRED XB:JYNWGNFA Valentina Lucks PROCEDURE DATE:  09/15/2012 PROCEDURE:   Colonoscopy with biopsy INDICATIONS:diarrhea, abd pain, nausea, weight loss.  LOST HER MOTHER: MRS.  GRUBBS THIS SUMMER.  CURRENT BENTYL DOSE MAKES HER NOT HAVE A BM FOR 6-7 DAYS.Marland Kitchen MEDICATIONS: Demerol 75 mg IV, Versed 6 mg IV, and Promethazine (Phenergan) 12.5mg  IV  DESCRIPTION OF PROCEDURE:    Physical exam was performed.  Informed consent was obtained from the patient after explaining the benefits, risks, and alternatives to procedure.  The patient was connected to monitor and placed in left lateral position. Continuous oxygen was provided by nasal cannula and IV medicine administered through an indwelling cannula.  After administration of sedation and rectal exam, the patients rectum was intubated and the EC-[id number] ([id number])  colonoscope was advanced under direct visualization to the ileum.  The scope was removed slowly by carefully examining the color, texture, anatomy, and integrity mucosa on the way out.  The patient was recovered in endoscopy and discharged home in satisfactory condition.       COLON FINDINGS: The mucosa appeared normal in the terminal ileum.  20 cm visualized. , Mild diverticulosis was noted in the ascending colon.  , Moderate diverticulosis was noted in the descending colon and sigmoid colon.  , NODULAR EDEMATOUS ERYTHEMATOUS RECTAL MUCOSA W/O EROSIONS OR ULCERS. Random biopsies obtained via cold forceps in the colon and rectum.  REDUNDANT SIGMOID COLON. and Small internal hemorrhoids were found.  PREP QUALITY: excellent. CECAL W/D TIME: 14 minutes  COMPLICATIONS: None  ENDOSCOPIC IMPRESSION: 1.   Normal mucosa in the terminal ileum 2.   Mild  diverticulosis was noted in the ascending colon 3.   Moderate diverticulosis was noted in the descending colon and sigmoid colon 4.   DIARRHEA MAY BE DUE TO PROCTITIS. NO SOURCE FOR ABD PAIN/ANOREXIA IDENTIFIED. 5.   Small internal hemorrhoids   RECOMMENDATIONS: 1.  PROCEED TO EGD.  TAKE 10 MG DICYCLOMINE 30 MINUTES PRIOR TO BREAKFAST AND SUPPER.  FOLLOW A HIGH FIBER/LOW FAT DIET.  AVOID ITEMS THAT CAUSE BLOATING.   BIOPSY WILL BE BACK IN 7 DAYS.  Follow up in 4 mos.  Next colonoscopy in 10 years WITH OVERTUBE. CONSIDER PROPOFOL.      _______________________________ Rosalie DoctorJonette Eva, MD 09/15/2012 11:52 AM Revised: 09/15/2012 11:52 AM    PATIENT NAME:  [name] MR#: [id number]

## 2012-09-15 NOTE — Op Note (Addendum)
[facility] [address] [place], [address]   ENDOSCOPY PROCEDURE REPORT  PATIENT: [name], [name]  MR#: [id number] BIRTHDATE: [birth year] , 41  yrs. old GENDER: Female  ENDOSCOPIST: Jonette Eva, MD REFFERED XB:JYNWGNFA Valentina Lucks  PROCEDURE DATE:  09/15/2012 PROCEDURE:   EGD with biopsy and EGD with dilatation over guidewire   INDICATIONS:1.  dyspepsia, dysphagia, weight loss.  ONLY WANTS TO DRINK LIQUIDS. MOTHER DIED THIS SUMMER. MEDICATIONS: TCS+ Versed 1mg  IV TOPICAL ANESTHETIC: Cetacaine Spray  DESCRIPTION OF PROCEDURE:   After the risks benefits and alternatives of the procedure were thoroughly explained, informed consent was obtained.  The [id number] ([id number])  endoscope was introduced through the mouth and advanced to the second portion of the duodenum.  The instrument was slowly withdrawn as the mucosa was carefully examined.  Prior to withdrawal of the scope, the guidwire was placed.  The esophagus was dilated successfully.  The patient was recovered in endoscopy and discharged home in satisfactory condition.      ESOPHAGUS: The mucosa of the esophagus appeared normal.  STOMACH: Moderate non-erosive gastritis (inflammation) with hemorrhage was found in the entire examined stomach.  Multiple biopsies were performed using cold forceps.  DUODENUM: The duodenal mucosa showed no abnormalities in the bulb and second portion of the duodenum.  Cold forcep biopsies were taken in the second portion.   Dilation was then performed at the gastroesphageal junction  Dilator: Savary over guidewire Size(s): 15-16 MM Resistance: minimal Heme: none  COMPLICATIONS: There were no complications.   ENDOSCOPIC IMPRESSION: 1.   The mucosa of the esophagus appeared normal 2.   Non-erosive gastritis (inflammation) with hemorrhage was found in the entire examined stomach; multiple biopsies 3.   The duodenal mucosa showed no abnormalities in the bulb and second  portion of the duodenum  ABD PAIN/ANOREXIA/WEIGHT LOSS MAY BE DUE TO MODERATE GASTRITIS. NO DEFINITE STRICTURE APPRECIATED. EMPIRIC DILATIONPERFORMED.  RECOMMENDATIONS: TAKE 1/2 A DICYCLOMINE 30 MINUTES PRIOR TO BREAKFAST AND SUPPER.  CONTINUE PROTONIX 30 MINUTES PRIOR TO YOUR FIRST MEAL.  FOLLOW A HIGH FIBER/LOW FAT DIET.  AVOID ITEMS THAT CAUSE BLOATING.   BIOPSY WILL BE BACK IN 7 DAYS.  Follow up in 4 mos. WILL NEED BPE IF DYSPHAGIA NOT IMPROVED.      _______________________________ Rosalie DoctorJonette Eva, MD 09/15/2012 12:22 PM Revised: 09/15/2012 12:22 PM     PATIENT NAME:  Lalani, Winkles MR#: [id number]

## 2012-09-16 ENCOUNTER — Telehealth: Payer: Self-pay | Admitting: Gastroenterology

## 2012-09-16 NOTE — Telephone Encounter (Signed)
Pt had tcs/egd yesterday and is having cramping in her abdomen and can't eat due to having nausea. Please advise and call patient at [phone]

## 2012-09-16 NOTE — Telephone Encounter (Signed)
Called and spoke to pt. She had TCS and EGD done yesterday. Had a lot of abdominal cramping, a couple of times she doubled over. She can hardly eat anything. Had 1/2 cheese toast, few bites of dry cereal and a couple of sugar cookies. She gets very nauseated and doesn't want anything to eat. Please advise!  Sending pager also.

## 2012-09-16 NOTE — Telephone Encounter (Signed)
Phone call from Dr. Darrick Penna, can tell pt that she can follow clear liquid diet for 3 days, continue Protonix and Bentyl. She feels her problems are from IBS. She can send you a prescription for nausea to the pharmacy.But if abdominal pain and nausea get real bad, she can go to the ED to be evaluated.

## 2012-09-16 NOTE — Telephone Encounter (Signed)
Called and informed pt. She said she has some Zofran she can use for now.

## 2012-09-17 NOTE — Telephone Encounter (Signed)
PLEASE CALL PT.  SHE NEEDS TO FOLLOW A FULL LIQUID DIET FOR 3 DAYS. CONTINUE DICYCLOMINE AND ZOFRAN PRN.

## 2012-09-18 ENCOUNTER — Other Ambulatory Visit: Payer: Self-pay

## 2012-09-18 ENCOUNTER — Other Ambulatory Visit: Payer: Self-pay | Admitting: Gastroenterology

## 2012-09-18 ENCOUNTER — Telehealth: Payer: Self-pay | Admitting: Gastroenterology

## 2012-09-18 ENCOUNTER — Encounter (HOSPITAL_COMMUNITY): Payer: Self-pay | Admitting: Gastroenterology

## 2012-09-18 ENCOUNTER — Ambulatory Visit (HOSPITAL_COMMUNITY)
Admission: RE | Admit: 2012-09-18 | Discharge: 2012-09-18 | Disposition: A | Discharge status: Home / Self Care | Payer: BC Managed Care – PPO | Source: Ambulatory Visit | Attending: Gastroenterology | Admitting: Gastroenterology

## 2012-09-18 DIAGNOSIS — R112 Nausea with vomiting, unspecified: Secondary | ICD-10-CM

## 2012-09-18 DIAGNOSIS — R109 Unspecified abdominal pain: Secondary | ICD-10-CM

## 2012-09-18 DIAGNOSIS — R1031 Right lower quadrant pain: Secondary | ICD-10-CM

## 2012-09-18 DIAGNOSIS — R110 Nausea: Secondary | ICD-10-CM | POA: Insufficient documentation

## 2012-09-18 DIAGNOSIS — R197 Diarrhea, unspecified: Secondary | ICD-10-CM | POA: Insufficient documentation

## 2012-09-18 DIAGNOSIS — R1032 Left lower quadrant pain: Secondary | ICD-10-CM

## 2012-09-18 MED ORDER — IOHEXOL 300 MG/ML  SOLN
100.0000 mL | Freq: Once | INTRAMUSCULAR | Status: AC | PRN
  Administered 2012-09-18: 100 mL via INTRAVENOUS

## 2012-09-18 NOTE — Telephone Encounter (Signed)
Called. Many rings and no answer.  

## 2012-09-18 NOTE — Telephone Encounter (Signed)
Path faxed to PCP, recall made 

## 2012-09-18 NOTE — Telephone Encounter (Signed)
REVIEWED.  

## 2012-09-18 NOTE — Telephone Encounter (Signed)
Pt returned call and was informed.  

## 2012-09-18 NOTE — Telephone Encounter (Signed)
Called and informed pt. Will fax her lab orders when I find out what time her CT is scheduled . She said she will need to be out of the hospital by 2:15 to pick up her kids from school.

## 2012-09-18 NOTE — Telephone Encounter (Addendum)
PLEASE CALL PT.  SHE SHOULD HOLD THE DICYCLOMINE. TAKE MIRALAX BID FOR 3 DAYS.  HER COLON AND RECTAL BIOPSIES ARE NORMAL. I WILL CALL HER WITH HER LAB RESULTS & ANSWER QUESTIONS AT THAT TIME.

## 2012-09-18 NOTE — Telephone Encounter (Signed)
Please call pt. Her colon and small bowel biopsies are normal.  She has gastritis. HER SX ARE MOST LIKELY DUE TO IBS, NOT HAVING A GALLBLADDER, & GASTRITIS.  Continue dicyclomine & Protonix. FOLLOW A LOW FAT/HIGH FIBER DIET STARTING NOV 17. You need a Ua/HFP & a CT ABD/pelvis to complete you evALuation for you abdominal pain, nausea, & vomiting. She should get her labs & ct done today.   Next colonoscopy in 10 years.

## 2012-09-18 NOTE — Telephone Encounter (Signed)
Called and informed pt.  

## 2012-09-18 NOTE — Progress Notes (Signed)
Opened in error

## 2012-09-18 NOTE — Telephone Encounter (Signed)
Called and informed pt. She said the Doxycycline is causing constipation. But if she does not take it she gets diarrhea. Forwarding to [facility] to fax Ct results to pt's GYN  ( [facility]).

## 2012-09-18 NOTE — Telephone Encounter (Signed)
Per Soledad Gerlach, I called pt and told her they said come to radiology now. I called [facility] lab and told them I was faxing over lab orders for the pt.

## 2012-09-18 NOTE — Telephone Encounter (Signed)
I called pt. She said they told her at hospital to go across the street. ( I had called [facility] lab and informed them she would be there when she did her CT and get labs done and I faxed the orders over. I told pt but she did not check with lab and radiology told her to go across the street. I just faxed the orders there and she is aware.

## 2012-09-18 NOTE — Telephone Encounter (Signed)
Pt called and said should she continue the Dycyclomine if it is causing constipation. And she also had questions about inflammation in her colon and rectum [place] [facility] Dr. Darrick Penna mentioned it to her sister the day of her procedure) she would like a call from Dr. Darrick Penna to discuss these questions. ( I told her that Dr. Darrick Penna is waiting on the lab results also).

## 2012-09-18 NOTE — Telephone Encounter (Signed)
Pt called at 1245 while nurse was at lunch. Pt said she just had her CT done and was confused if she had dreamed it or the nurse had told her to have a UA and labs done. I told patient she did need to have these done and to go across the street to [facility] and they should have the orders there on her. Then patient started talking about why did the XR tech made her wait in the waiting room 40 minutes thinking that SF was going to come out and talk to her about her results. I told her that once we received the report and SF reviews it that the nurse would call her. Then she wanted to know if that would be today. I told her it may be 3 to 4 days but if something was wrong she would be notified sooner. Patient asked to speak with JL since DS wasn't available and I told her that JL was going to lunch and DS should be back in 15 minutes and that's who she needs to speak with. Please call patient on her cell phone.

## 2012-09-18 NOTE — Telephone Encounter (Signed)
PLEASE CALL PT. HER CT SHOWS CONSTIPATION AND ENLARGED VEINS IN HER LEFT PELVIS. THESE MAY BE CONTRIBUTING TO HER PAIN. I WILL CALL HER WHEN THE BLOOD AND LABS TEST COME BACK. CONTINUE FULL LIQUID DIET. SHE MAY USE ZOFRAN AS NEEDED. USE IBUPROFEN 200 MG OTC BID WITH FOOD OR MILK FOR THE NEXT 3 DAYS. CONTINUE PROTONIX. 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

## 2012-09-19 LAB — URINALYSIS, ROUTINE W REFLEX MICROSCOPIC
Bilirubin Urine: NEGATIVE
Glucose, UA: NEGATIVE mg/dL
Ketones, ur: NEGATIVE mg/dL
pH: 6 (ref 5.0–8.0)

## 2012-09-19 LAB — HEPATIC FUNCTION PANEL
ALT: 12 U/L (ref 0–35)
Alkaline Phosphatase: 44 U/L (ref 39–117)
Indirect Bilirubin: 0.3 mg/dL (ref 0.0–0.9)
Total Protein: 6.5 g/dL (ref 6.0–8.3)

## 2012-09-19 LAB — URINALYSIS, MICROSCOPIC ONLY
Bacteria, UA: NONE SEEN
Casts: NONE SEEN

## 2012-09-19 NOTE — Telephone Encounter (Addendum)
PLEASE CALL PT.   HER UA & LIVER TEST ARE NORMAL. 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 EXPLAINED IBS MIXED.  STOP DICYCLOMINE. USE MIRALAX WHEN SHE HAS CONSTIPATION. USE DICYCLOMINE WHEN SHE HAS DIARRHEA. FOLLOW A HIGH FIBER DIET. SEE JENNIFER GRIFFIN FOR PELVIC CONGESTION SYNDROME. OPV IN DEC 2013 E30  SLF AND MAR 2014, DX: ABD PAIN W/ KJ.

## 2012-09-19 NOTE — Telephone Encounter (Signed)
CT faxed to PCP, appt made for DEC

## 2012-09-22 ENCOUNTER — Telehealth: Payer: Self-pay | Admitting: *Deleted

## 2012-09-22 NOTE — Telephone Encounter (Signed)
Called and informed pt.  

## 2012-09-22 NOTE — Telephone Encounter (Signed)
[name] called today. She is still having trouble eating and having diarrhea.  She would like for you to call her back. Thanks.

## 2012-09-22 NOTE — Telephone Encounter (Signed)
Called home. Many rings and no answer. Called cell, LMOM to answer.

## 2012-09-22 NOTE — Telephone Encounter (Signed)
PLEASE CALL PT.  If she is having diarrhea, she should use her dicyclomine 1-2 po 30 minutes prior to meals for the next 3 days. She should STOP taking thE Miralax.

## 2012-09-22 NOTE — Telephone Encounter (Signed)
Pt returned call. Said she took 2 doses of the Miralax about Friday and was up all night Friday night and Sat AM. She has tried to eat as she was instructed but everything goes striagh trough her. She had raisin brand today and has had 5 episodes of diarrhea since that. Please advise!

## 2012-10-09 ENCOUNTER — Ambulatory Visit (INDEPENDENT_AMBULATORY_CARE_PROVIDER_SITE_OTHER): Payer: BC Managed Care – PPO | Admitting: Psychology

## 2012-10-09 DIAGNOSIS — K589 Irritable bowel syndrome without diarrhea: Secondary | ICD-10-CM

## 2012-10-09 DIAGNOSIS — G4700 Insomnia, unspecified: Secondary | ICD-10-CM

## 2012-10-09 DIAGNOSIS — F419 Anxiety disorder, unspecified: Secondary | ICD-10-CM

## 2012-10-09 DIAGNOSIS — F339 Major depressive disorder, recurrent, unspecified: Secondary | ICD-10-CM

## 2012-10-09 DIAGNOSIS — F411 Generalized anxiety disorder: Secondary | ICD-10-CM

## 2012-10-13 ENCOUNTER — Telehealth: Payer: Self-pay | Admitting: *Deleted

## 2012-10-13 NOTE — Telephone Encounter (Signed)
Ms Corbit called. She is not getting any relief from using Miralax and is needing something else to help her. Please call her back. Thanks.

## 2012-10-14 NOTE — Telephone Encounter (Signed)
Called and informed pt. Samples at front and she will have her sister pick them up this afternoon.

## 2012-10-14 NOTE — Telephone Encounter (Signed)
Called pt, She said she has taken Miralax once daily x 3 days and still has not had a BM. She said she feels very bloated and feels like it is ready to come out, but just won't come. She said she has not had a BM for 8 days. She is beginning to fear nausea and is burping a lot. Please advise!

## 2012-10-14 NOTE — Telephone Encounter (Signed)
Trial linzess 145mg  daily. May pick up 14 days samples. Thanks

## 2012-10-16 ENCOUNTER — Ambulatory Visit (INDEPENDENT_AMBULATORY_CARE_PROVIDER_SITE_OTHER): Payer: BC Managed Care – PPO | Admitting: Psychology

## 2012-10-16 ENCOUNTER — Encounter (HOSPITAL_COMMUNITY): Payer: Self-pay | Admitting: Psychology

## 2012-10-16 DIAGNOSIS — F419 Anxiety disorder, unspecified: Secondary | ICD-10-CM

## 2012-10-16 DIAGNOSIS — G4700 Insomnia, unspecified: Secondary | ICD-10-CM

## 2012-10-16 DIAGNOSIS — K589 Irritable bowel syndrome without diarrhea: Secondary | ICD-10-CM

## 2012-10-16 DIAGNOSIS — F329 Major depressive disorder, single episode, unspecified: Secondary | ICD-10-CM

## 2012-10-16 DIAGNOSIS — F411 Generalized anxiety disorder: Secondary | ICD-10-CM

## 2012-10-16 NOTE — Progress Notes (Signed)
Patient:  [name]   DOB: May 06, 1971  MR Number: [id number]  Location: BEHAVIORAL [facility] [address] [place] [address] Dept: [phone]  Start: 11 AM End: 12 PM  Provider/Observer:     Hershal Coria PSYD  Chief Complaint:      Chief Complaint  Patient presents with  . Anxiety  . Depression    Reason For Service:    The patient was referred by a friend for psychotherapeutic interventions do to the development of significant depression, insomnia, weight loss, loss of appetite, anxiety. She has been diagnosed with irritable bowel syndrome as numerous gastrointestinal issues. The patient reports that a major stressor was a loss of her grandmother whom she had been taking care of and was dying and the patient put everything on her life on the back burner to take care of his grandmother. The grandmother, whom she become very close to passed away and she reports that now she feels like a very different person she used to be. She reports this is been going on for several months and has been around for at least 6+ months. The patient reports this is produce a major factor in her life. She reports that she was having significant gastrointestinal difficulties throughout this time but essentially put these issues on the back burner to take care of her grandmother.   Interventions Strategy:  Cognitive/behavioral psychotherapeutic interventions  Participation Level:   Active  Participation Quality:  Appropriate      Behavioral Observation:  Well Groomed, Alert, and Appropriate.   Current Psychosocial Factors: The patient reports that she is continued to be stressed by various aspects of her life including issues with the loss of her grandmother.  Content of Session:   Review current symptoms and continued work on therapeutic interventions for issues of depression, anxiety, and IBS.  Current  Status:   The patient reports continued symptoms of depression and anxiety including lack of energy, insomnia, and gastrointestinal issues  Patient Progress:   Stable  Target Goals:   Target goals include reducing the intensity, frequency, and duration of symptoms related to depression including feelings of helplessness and hopelessness, insomnia, agitation, and feelings of sadness and loss. He also work on reducing the issues of anxiety and issues related to irritable bowel syndrome.  Last Reviewed:   10/16/2012  Goals Addressed Today:    Today we worked on cognitive/behavioral psychotherapeutic interventions for issues of depression, anxiety, and insomnia.  Impression/Diagnosis:   At this point, the patient has been through a major stressor with taking care of her sick grandmother who would raised her for quite a long time but more recently he the grandmother passed away and she is developed significant depression, insomnia, anxiety, weight loss, loss of appetite, and gastrointestinal difficulties.   Diagnosis:    Axis I:  1. Major depressive disorder   2. Anxiety   3. Insomnia   4. Irritable bowel syndrome         Axis II: No diagnosis
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
## 2012-10-23 ENCOUNTER — Ambulatory Visit (INDEPENDENT_AMBULATORY_CARE_PROVIDER_SITE_OTHER): Payer: BC Managed Care – PPO | Admitting: Gastroenterology

## 2012-10-23 ENCOUNTER — Encounter: Payer: Self-pay | Admitting: Gastroenterology

## 2012-10-23 VITALS — BP 96/64 | HR 69 | Temp 97.6°F | Ht 68.0 in | Wt 116.2 lb

## 2012-10-23 DIAGNOSIS — K529 Noninfective gastroenteritis and colitis, unspecified: Secondary | ICD-10-CM

## 2012-10-23 DIAGNOSIS — R634 Abnormal weight loss: Secondary | ICD-10-CM

## 2012-10-23 DIAGNOSIS — R110 Nausea: Secondary | ICD-10-CM

## 2012-10-23 DIAGNOSIS — R197 Diarrhea, unspecified: Secondary | ICD-10-CM

## 2012-10-23 MED ORDER — ONDANSETRON HCL 8 MG PO TABS: 8.0000 mg | ORAL_TABLET | Freq: Three times a day (TID) | ORAL | Status: DC | PRN

## 2012-10-23 MED ORDER — MIRTAZAPINE 7.5 MG PO TABS: 7.5000 mg | ORAL_TABLET | Freq: Every day | ORAL | Status: DC

## 2012-10-23 NOTE — Patient Instructions (Signed)
STOP TAKING LEXAPRO. START TAKING REMERON 2 PILLS AT BEDTIME. TAKE YOUR FIRST DOSE TONIGHT.  TAKE BENTYL 1/2 TAB TWICE DAILY.  AVOID DAIRY. SEE INFO BELOW.  ADD A PROBIOTIC EVERY DAY (ALIGN, PHILLIP'S COLON HEALTH, OR STORE BRAND HIGH DOSE PROBIOTIC).  DRINK CARNATION INSTANT BREAKFAST WITH SOY OR ALMOND MILK FOUR TIMES A DAY.  COMPLETE THE GASTRIC EMPTYING STUDY.  FOLLOW UP IN ONE MONTH.   Lactose Free Diet Lactose is a carbohydrate that is found mainly in milk and milk products, as well as in foods with added milk or whey. Lactose must be digested by the enzyme in order to be used by the body. Lactose intolerance occurs when there is a shortage of lactase. When your body is not able to digest lactose, you may feel sick to your stomach (nausea), bloating, cramping, gas and diarrhea.  There are many dairy products that may be tolerated better than milk by some people:  The use of cultured dairy products such as yogurt, buttermilk, cottage cheese, and sweet acidophilus milk (Kefir) for lactase-deficient individuals is usually well tolerated. This is because the healthy bacteria help digest lactose.   Lactose-hydrolyzed milk (Lactaid) contains 40-90% less lactose than milk and may also be well tolerated.    SPECIAL NOTES  Lactose is a carbohydrates. The major food source is dairy products. Reading food labels is important. Many products contain lactose even when they are not made from milk. Look for the following words: whey, milk solids, dry milk solids, nonfat dry milk powder. Typical sources of lactose other than dairy products include breads, candies, cold cuts, prepared and processed foods, and commercial sauces and gravies.   All foods must be prepared without milk, cream, or other dairy foods.   Soy milk and lactose-free supplements (LACTASE) may be used as an alternative to milk.   FOOD GROUP ALLOWED/RECOMMENDED AVOID/USE SPARINGLY  BREADS / STARCHES 4 servings or more*  Breads and rolls made without milk. [place], [place], or [place] bread. Breads and rolls that contain milk. Prepared mixes such as muffins, biscuits, waffles, pancakes. Sweet rolls, donuts, [place] toast (if made with milk or lactose).  Crackers: Soda crackers, graham crackers. Any crackers prepared without lactose. Zwieback crackers, corn curls, or any that contain lactose.  Cereals: Cooked or dry cereals prepared without lactose (read labels). Cooked or dry cereals prepared with lactose (read labels). Total, Cocoa Krispies. Special K.  Potatoes / Pasta / Rice: Any prepared without milk or lactose. Popcorn. Instant potatoes, frozen [place] fries, scalloped or au gratin potatoes.  VEGETABLES 2 servings or more Fresh, frozen, and canned vegetables. Creamed or breaded vegetables. Vegetables in a cheese sauce or with lactose-containing margarines.  FRUIT 2 servings or more All fresh, canned, or frozen fruits that are not processed with lactose. Any canned or frozen fruits processed with lactose.  MEAT & SUBSTITUTES 2 servings or more (4 to 6 oz. total per day) Plain beef, chicken, fish, [place], lamb, veal, pork, or ham. Kosher prepared meat products. Strained or junior meats that do not contain milk. Eggs, soy meat substitutes, nuts. Scrambled eggs, omelets, and souffles that contain milk. Creamed or breaded meat, fish, or fowl. Sausage products such as wieners, liver sausage, or cold cuts that contain milk solids. Cheese, cottage cheese, or cheese spreads.  MILK None. (See "BEVERAGES" for milk substitutes. See "DESSERTS" for ice cream and frozen desserts.) Milk (whole, 2%, skim, or chocolate). Evaporated, powdered, or condensed milk; malted milk.  SOUPS & COMBINATION FOODS Bouillon, broth, vegetable soups, clear  soups, consomms. Homemade soups made with allowed ingredients. Combination or prepared foods that do not contain milk or milk products (read labels). Cream soups, chowders, commercially prepared  soups containing lactose. Macaroni and cheese, pizza. Combination or prepared foods that contain milk or milk products.  DESSERTS & SWEETS In moderation Water and fruit ices; gelatin; angel food cake. Homemade cookies, pies, or cakes made from allowed ingredients. Pudding (if made with water or a milk substitute). Lactose-free tofu desserts. Sugar, honey, corn syrup, jam, jelly; marmalade; molasses (beet sugar); Pure sugar candy; marshmallows. Ice cream, ice milk, sherbet, custard, pudding, frozen yogurt. Commercial cake and cookie mixes. Desserts that contain chocolate. Pie crust made with milk-containing margarine; reduced-calorie desserts made with a sugar substitute that contains lactose. Toffee, peppermint, butterscotch, chocolate, caramels.  FATS & OILS In moderation Butter (as tolerated; contains very small amounts of lactose). Margarines and dressings that do not contain milk, Vegetable oils, shortening, Miracle Whip, mayonnaise, nondairy cream & whipped toppings without lactose or milk solids added (examples: Coffee Rich, Carnation Coffeemate, Rich's Whipped Topping, PolyRich). Tomasa Blase. Margarines and salad dressings containing milk; cream, cream cheese; peanut butter with added milk solids, sour cream, chip dips, made with sour cream.  BEVERAGES Carbonated drinks; tea; coffee and freeze-dried coffee; some instant coffees (check labels). Fruit drinks; fruit and vegetable juice; Rice or Soy milk. Ovaltine, hot chocolate. Some cocoas; some instant coffees; instant iced teas; powdered fruit drinks (read labels).   CONDIMENTS / MISCELLANEOUS Soy sauce, carob powder, olives, gravy made with water, baker's cocoa, pickles, pure seasonings and spices, wine, pure monosodium glutamate, catsup, mustard. Some chewing gums, chocolate, some cocoas. Certain antibiotics and vitamin / mineral preparations. Spice blends if they contain milk products. MSG extender. Artificial sweeteners that contain lactose such as  Equal (Nutra-Sweet) and Sweet 'n Low. Some nondairy creamers (read labels).   SAMPLE MENU*  Breakfast   Orange Juice.  Banana.   Bran flakes.   Nondairy Creamer.  Vienna Bread (toasted).   Butter or milk-free margarine.   Coffee or tea.    Noon Meal   Chicken Breast.  Rice.   Green beans.   Butter or milk-free margarine.  Fresh melon.   Coffee or tea.    Evening Meal   Roast Beef.  Baked potato.   Butter or milk-free margarine.   Broccoli.   Lettuce salad with vinegar and oil dressing.  [employer].   Coffee or tea.
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
## 2012-10-23 NOTE — Assessment & Plan Note (Signed)
NAUSEA LIKELY DUE TO DEPRESSION.  ZOFRAN PRN. GES WITHIN 7 DAYS. OPV IN 1 MO.

## 2012-10-23 NOTE — Assessment & Plan Note (Addendum)
PT LOST 8 LBS SINCE OCT 2013. CONTINUE TO C/O DEPRESSION AND ANOREXIA. GI W/U REVEALED GASTRITIS. PT LIKELY HAS IBS-S.  STOP LEXAPRO. CHANGE TO REMERON TO TREAT SLEEP DISTURBANCE, STIMULATE APPETITE, AND TREAT DEPRESSION. MEDICATION INTERACTION REVIEWED WITH [phone]. DRINK CIB WITH SOY OR ALMOND MILK QID OBTAIN GES. OPV IN 1 MO.

## 2012-10-23 NOTE — Progress Notes (Signed)
Faxed to PCP

## 2012-10-23 NOTE — Assessment & Plan Note (Signed)
MOST LIKELY DUE TO IBS-D.  TAKE BENTYL TWICE DAILY. AVOID DAIRY. ADD PROBIOTIC DAILY. ENCOURAGED PT TO CONTINUE TO PROCESS HER LOSS. SHE IS AWARE DEPRESSION CAN HAVE PHYSICAL MANIFESTATIONS. OPV IN 1 MO

## 2012-10-27 ENCOUNTER — Ambulatory Visit (HOSPITAL_COMMUNITY)
Admission: RE | Admit: 2012-10-27 | Discharge: 2012-10-27 | Disposition: A | Discharge status: Home / Self Care | Payer: BC Managed Care – PPO | Source: Ambulatory Visit | Attending: Gastroenterology | Admitting: Gastroenterology

## 2012-10-27 ENCOUNTER — Encounter (HOSPITAL_COMMUNITY): Payer: Self-pay

## 2012-10-27 DIAGNOSIS — R634 Abnormal weight loss: Secondary | ICD-10-CM | POA: Insufficient documentation

## 2012-10-27 DIAGNOSIS — R110 Nausea: Secondary | ICD-10-CM | POA: Insufficient documentation

## 2012-10-27 MED ORDER — TECHNETIUM TC 99M SULFUR COLLOID
2.0000 | Freq: Once | INTRAVENOUS | Status: AC | PRN
Start: 2012-10-27 — End: 2012-10-27
  Administered 2012-10-27: 1.9 via ORAL

## 2012-10-28 ENCOUNTER — Telehealth: Payer: Self-pay

## 2012-10-28 NOTE — Telephone Encounter (Signed)
Called and informed pt.  

## 2012-10-28 NOTE — Telephone Encounter (Signed)
PLEASE CALL PT. SHE MAY NEED A LOWER DOSE INITIALLY AND MAY DEVELOP TOLERANCE FOR A HIGHER DOSE. ASK HER TO TAKE REMERON 7.5 MG QHS FOR 7 DAYS AND THEN INCREASE TO 2 PO QHS. CALL IF SHE BEGINS TO FEEL SHAKY & NERVOUS WHEN SHE START TO TAKE 2 AT BEDTIME AGAIN.

## 2012-10-28 NOTE — Telephone Encounter (Signed)
I called pt to give GES results. See 10/23/2012 .note dated 10/28/2012.   Pt says she is taking 2 of the 7.5 mg of Remeron at bedtime and she sleeps very well, but the next morning when she gets up she feels very shaky inside and very nervous. ( She said it helps her rest but not quite as good as the Ambien). She just feels so shaky the next day, please advise!

## 2012-10-28 NOTE — Progress Notes (Signed)
PLEASE CALL PT. HER GES IS NORMAL. HER WEIGHT LOST, DIARRHEA, AND NAUSEA ARE MOST LIKELY DUE TO IBS AND DEPRESSION.  TAKE REMERON TO TREAT SLEEP DISTURBANCE, STIMULATE APPETITE, AND TREAT DEPRESSION.  DRINK CIB WITH SOY OR ALMOND MILK QID  TAKE BENTYL TWICE DAILY.  AVOID DAIRY.  ADD PROBIOTIC DAILY.  ZOFRAN PRN.  ENCOURAGED PT TO CONTINUE TO PROCESS HER LOSS. SHE IS AWARE DEPRESSION CAN HAVE PHYSICAL MANIFESTATIONS.  FOLLOW UP IN 1 MO.

## 2012-10-28 NOTE — Progress Notes (Signed)
Faxed to PCP

## 2012-11-03 NOTE — Progress Notes (Signed)
Pt is aware of OV on 1/29 at 11 with SF

## 2012-11-04 ENCOUNTER — Telehealth: Payer: Self-pay | Admitting: Gastroenterology

## 2012-11-04 NOTE — Telephone Encounter (Signed)
Pt called this morning asking for the nurse to call her back. She said that her medicines are not working and they are making her "feel funny" and all she does is cry, but they do help her sleep at night. She is wanting something else. She did not tell me what medicines she was talking about. Please advise patient and call her at [phone]

## 2012-11-04 NOTE — Telephone Encounter (Signed)
Called pt. She was taken off Celexa because she had been on it a long time and it was not as effective. She is trying to get in Dr. Anthony Sar office for PCP and hopefully will have appt soon and discuss with them.

## 2012-11-04 NOTE — Telephone Encounter (Signed)
Stop Remeron. She can try tylenol PM for help sleeping at night. If heart continues to race or feels dehydrated, she should be seen in ER over holiday Plenty fluids Trial levsin 0.125mg  before meals/bedtime for diarrhea, #60, 0RF Verify no hx glaucoma Align daily Thanks

## 2012-11-04 NOTE — Telephone Encounter (Signed)
Called pt to get more info. Many rings and no answer.

## 2012-11-04 NOTE — Telephone Encounter (Signed)
Pt wants to know if she can go back on the Celexa or the Lexapro until she sees new PCP. ( She has some of both).

## 2012-11-04 NOTE — Telephone Encounter (Signed)
Pt informed of all of the info. She said she does not have Glaucoma. She is taking the Align daily. Rx called to [facility] [place]. ( KJ OK'd sublinqual tablets OK.)

## 2012-11-04 NOTE — Telephone Encounter (Signed)
Why did she stop them to begin with? Celexa will take a while to start working again, however as long as she did not have side effects, she can resume Celexa as directed previously. It would be in her best interest to talk to a PCP for management of her anxiety meds. Does she have a PCP appt upcoming?  If not, she needs one. Thanks

## 2012-11-04 NOTE — Telephone Encounter (Signed)
Pt called back. She said she is just very shaky with the Remeron. The decreased amount is not helping her sleep, but she still has the shakes with it. She didn't take any last night because she was afraid to, she felt like her heart was racing for awhile yesterday. She had diarrhea all day yesterday, does not know how many times she went. She has not had any thus far today.She said she had been on Celexa and then Lexapro and the Remeron was in place of that.  She is in the process of trying to get in Dr. Maren Reamer office for PCP.  Please advise!  She is aware that Dr. Darrick Penna is out until Thurs and I will send to Lorenza Burton, NP for some advise.

## 2012-11-07 NOTE — Progress Notes (Signed)
Pt was informed on 10/28/2012.

## 2012-11-10 ENCOUNTER — Encounter (HOSPITAL_COMMUNITY): Payer: Self-pay | Admitting: Psychology

## 2012-11-10 ENCOUNTER — Ambulatory Visit (INDEPENDENT_AMBULATORY_CARE_PROVIDER_SITE_OTHER): Payer: BC Managed Care – PPO | Admitting: Psychology

## 2012-11-10 DIAGNOSIS — K589 Irritable bowel syndrome without diarrhea: Secondary | ICD-10-CM

## 2012-11-10 DIAGNOSIS — F329 Major depressive disorder, single episode, unspecified: Secondary | ICD-10-CM

## 2012-11-10 DIAGNOSIS — F419 Anxiety disorder, unspecified: Secondary | ICD-10-CM

## 2012-11-10 DIAGNOSIS — F411 Generalized anxiety disorder: Secondary | ICD-10-CM

## 2012-11-10 DIAGNOSIS — G4700 Insomnia, unspecified: Secondary | ICD-10-CM

## 2012-11-10 NOTE — Progress Notes (Signed)
Patient:  [name]   DOB: 1971/08/12  MR Number: [id number]  Location: BEHAVIORAL [facility] [address] [place] [address] Dept: [phone]  Start: 10 AM End: 11 AM  Provider/Observer:     Hershal Coria PSYD  Chief Complaint:      Chief Complaint  Patient presents with  . Anxiety  . Depression  . Stress    Reason For Service:    The patient was referred by a friend for psychotherapeutic interventions do to the development of significant depression, insomnia, weight loss, loss of appetite, anxiety. She has been diagnosed with irritable bowel syndrome as numerous gastrointestinal issues. The patient reports that a major stressor was a loss of her grandmother whom she had been taking care of and was dying and the patient put everything on her life on the back burner to take care of his grandmother. The grandmother, whom she become very close to passed away and she reports that now she feels like a very different person she used to be. She reports this is been going on for several months and has been around for at least 6+ months. The patient reports this is produce a major factor in her life. She reports that she was having significant gastrointestinal difficulties throughout this time but essentially put these issues on the back burner to take care of her grandmother.   Interventions Strategy:  Cognitive/behavioral psychotherapeutic interventions  Participation Level:   Active  Participation Quality:  Appropriate      Behavioral Observation:  Well Groomed, Alert, and Appropriate.   Current Psychosocial Factors: The patient reports that she made it through the holiday season okay and that her depression has improved somewhat. However, she reports that she is still having significant problems going to sleep..  Content of Session:   Review current symptoms and continued work on therapeutic  interventions for issues of depression, anxiety, and IBS.  Current Status:   The patient reports that she has been improving her diet and doing more physically active things but continues to have significant sleep problems. She does have Ambien for sleep but she is allowed to take every other day but not more frequently.  Patient Progress:   Stable  Target Goals:   Target goals include reducing the intensity, frequency, and duration of symptoms related to depression including feelings of helplessness and hopelessness, insomnia, agitation, and feelings of sadness and loss. He also work on reducing the issues of anxiety and issues related to irritable bowel syndrome.  Last Reviewed:   11/11/2011  Goals Addressed Today:    Today we worked on cognitive/behavioral psychotherapeutic interventions for issues of depression, anxiety, and insomnia.  Impression/Diagnosis:   At this point, the patient has been through a major stressor with taking care of her sick grandmother who would raised her for quite a long time but more recently he the grandmother passed away and she is developed significant depression, insomnia, anxiety, weight loss, loss of appetite, and gastrointestinal difficulties.   Diagnosis:    Axis I:  1. Major depressive disorder   2. Anxiety   3. Insomnia   4. Irritable bowel syndrome         Axis II: No diagnosis

## 2012-11-25 ENCOUNTER — Ambulatory Visit (HOSPITAL_COMMUNITY): Payer: Self-pay | Admitting: Psychology

## 2012-11-27 ENCOUNTER — Ambulatory Visit (INDEPENDENT_AMBULATORY_CARE_PROVIDER_SITE_OTHER): Payer: BC Managed Care – PPO | Admitting: Psychology

## 2012-11-27 DIAGNOSIS — F331 Major depressive disorder, recurrent, moderate: Secondary | ICD-10-CM

## 2012-11-27 DIAGNOSIS — F411 Generalized anxiety disorder: Secondary | ICD-10-CM

## 2012-11-27 DIAGNOSIS — K589 Irritable bowel syndrome without diarrhea: Secondary | ICD-10-CM

## 2012-11-27 DIAGNOSIS — G4700 Insomnia, unspecified: Secondary | ICD-10-CM

## 2012-12-03 ENCOUNTER — Ambulatory Visit: Payer: Self-pay | Admitting: Gastroenterology

## 2012-12-03 ENCOUNTER — Other Ambulatory Visit: Payer: Self-pay

## 2012-12-03 MED ORDER — PANTOPRAZOLE SODIUM 40 MG PO TBEC: 40.0000 mg | DELAYED_RELEASE_TABLET | Freq: Every day | ORAL | Status: DC

## 2012-12-10 ENCOUNTER — Ambulatory Visit (INDEPENDENT_AMBULATORY_CARE_PROVIDER_SITE_OTHER): Payer: BC Managed Care – PPO | Admitting: Psychiatry

## 2012-12-10 ENCOUNTER — Encounter (HOSPITAL_COMMUNITY): Payer: Self-pay | Admitting: Psychiatry

## 2012-12-10 VITALS — Ht 68.0 in | Wt 116.8 lb

## 2012-12-10 DIAGNOSIS — F489 Nonpsychotic mental disorder, unspecified: Secondary | ICD-10-CM

## 2012-12-10 DIAGNOSIS — E559 Vitamin D deficiency, unspecified: Secondary | ICD-10-CM | POA: Insufficient documentation

## 2012-12-10 DIAGNOSIS — F5105 Insomnia due to other mental disorder: Secondary | ICD-10-CM

## 2012-12-10 DIAGNOSIS — F332 Major depressive disorder, recurrent severe without psychotic features: Secondary | ICD-10-CM

## 2012-12-10 DIAGNOSIS — F418 Other specified anxiety disorders: Secondary | ICD-10-CM | POA: Insufficient documentation

## 2012-12-10 DIAGNOSIS — F411 Generalized anxiety disorder: Secondary | ICD-10-CM

## 2012-12-10 MED ORDER — ESCITALOPRAM OXALATE 20 MG PO TABS: 30.0000 mg | ORAL_TABLET | Freq: Every day | ORAL | Status: DC

## 2012-12-10 MED ORDER — ZOLPIDEM TARTRATE 5 MG PO TABS: 5.0000 mg | ORAL_TABLET | Freq: Every evening | ORAL | Status: DC | PRN

## 2012-12-10 NOTE — Patient Instructions (Addendum)
Relaxation is the ultimate solution for you.  You can seek it through tub baths, bubble baths, essential oils or incense, walking or chatting with friends, listening to soft music, watching a candle burn and just letting all thoughts go and appreciating the true essence of the Creator.   Yoga is a very helpful exercise method.  On TV or on line [employer] is a source of high quality information about yoga and videos on yoga.  Renee Ramus is the world's number one video yoga instructor according to some experts.  There are exceptional health benefits that can be achieved through yoga.  The main principles of yoga is acceptance, no competition, no comparison, and no judgement.  It is exceptional in helping people meditate and get to a very relaxed state.   Exercise or go for a walk every day.  Start with 8 minutes a day and build up to 22 minutes every day.  Record your effort and bring in for the next appointment.  CUT BACK/CUT OUT on sugar and carbohydrates, that means very limited fruits and starchy vegetables and very limited grains, breads  The goal is low GLYCEMIC INDEX.  CUT OUT all wheat, rye, or barley for the GLUTEN in them.  HIGH fat and LOW carbohydrate diet is the KEY.  Eat avocados, eggs, lean meat like grass fed beef and chicken  Nuts and seeds would be good foods as well.   Stevia is an excellent sweetener.  Safe for the brain.   Almond butter is awesome.  Check out all this on the Internet.  Call if problems or concerns.
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
## 2012-12-11 LAB — VITAMIN D 25 HYDROXY (VIT D DEFICIENCY, FRACTURES): Vit D, 25-Hydroxy: 17 ng/mL — ABNORMAL LOW (ref 30–89)

## 2012-12-12 ENCOUNTER — Telehealth (HOSPITAL_COMMUNITY): Payer: Self-pay | Admitting: Psychiatry

## 2012-12-12 ENCOUNTER — Ambulatory Visit (HOSPITAL_COMMUNITY): Payer: Self-pay | Admitting: Psychology

## 2012-12-12 MED ORDER — CALCIUM CARBONATE-VITAMIN D 500-200 MG-UNIT PO TABS: 1.0000 | ORAL_TABLET | Freq: Two times a day (BID) | ORAL | Status: DC

## 2012-12-12 MED ORDER — ERGOCALCIFEROL 1.25 MG (50000 UT) PO CAPS: 50000.0000 [IU] | ORAL_CAPSULE | ORAL | Status: DC

## 2012-12-12 NOTE — Addendum Note (Signed)
Addended by: Mike Craze on: 12/12/2012 04:06 PM   Modules accepted: Orders

## 2012-12-22 ENCOUNTER — Encounter (HOSPITAL_COMMUNITY): Payer: Self-pay | Admitting: Psychology

## 2012-12-22 ENCOUNTER — Ambulatory Visit (INDEPENDENT_AMBULATORY_CARE_PROVIDER_SITE_OTHER): Payer: BC Managed Care – PPO | Admitting: Psychology

## 2012-12-22 DIAGNOSIS — K589 Irritable bowel syndrome without diarrhea: Secondary | ICD-10-CM

## 2012-12-22 DIAGNOSIS — G4700 Insomnia, unspecified: Secondary | ICD-10-CM

## 2012-12-22 DIAGNOSIS — F411 Generalized anxiety disorder: Secondary | ICD-10-CM

## 2012-12-22 DIAGNOSIS — F331 Major depressive disorder, recurrent, moderate: Secondary | ICD-10-CM

## 2012-12-22 NOTE — Progress Notes (Signed)
Patient:  [name]   DOB: [birth year]  MR Number: [id number]  Location: BEHAVIORAL [facility] [address] [place] [address] Dept: [phone]  Start: 10 AM End: 11 AM  Provider/Observer:     Hershal Coria PSYD  Chief Complaint:      Chief Complaint  Patient presents with  . Anxiety  . Depression  . Stress    Reason For Service:    The patient was referred by a friend for psychotherapeutic interventions do to the development of significant depression, insomnia, weight loss, loss of appetite, anxiety. She has been diagnosed with irritable bowel syndrome as numerous gastrointestinal issues. The patient reports that a major stressor was a loss of her grandmother whom she had been taking care of and was dying and the patient put everything on her life on the back burner to take care of his grandmother. The grandmother, whom she become very close to passed away and she reports that now she feels like a very different person she used to be. She reports this is been going on for several months and has been around for at least 6+ months. The patient reports this is produce a major factor in her life. She reports that she was having significant gastrointestinal difficulties throughout this time but essentially put these issues on the back burner to take care of her grandmother.   Interventions Strategy:  Cognitive/behavioral psychotherapeutic interventions  Participation Level:   Active  Participation Quality:  Appropriate      Behavioral Observation:  Well Groomed, Alert, and Appropriate.   Current Psychosocial Factors: The patient reports that she is dealing a little bit better with issues around a loss of her grandmother but she continues to have difficulty with her appetite and losing weight. The patient reports that issues of depression have improved somewhat and she has been actively working on  behavioral interventions.  Content of Session:   Review current symptoms and continued work on therapeutic interventions for issues of depression, anxiety, and IBS.  Current Status:   The patient reports that she is still working on issues of her diet and they have been improving a great deal. While her appetite continues to be quite low the quality of foods that she is eating is better. She has been working on exercise regimen as well as well as getting out and interact with people.  Patient Progress:   Stable  Target Goals:   Target goals include reducing the intensity, frequency, and duration of symptoms related to depression including feelings of helplessness and hopelessness, insomnia, agitation, and feelings of sadness and loss. He also work on reducing the issues of anxiety and issues related to irritable bowel syndrome.  Last Reviewed:   12/22/2012  Goals Addressed Today:    Today we worked on cognitive/behavioral psychotherapeutic interventions for issues of depression, anxiety, and insomnia.  Impression/Diagnosis:   At this point, the patient has been through a major stressor with taking care of her sick grandmother who would raised her for quite a long time but more recently he the grandmother passed away and she is developed significant depression, insomnia, anxiety, weight loss, loss of appetite, and gastrointestinal difficulties.   Diagnosis:    Axis I:  Major depressive disorder, recurrent episode, moderate  Anxiety state, unspecified  Insomnia  Irritable bowel syndrome      Axis II: No diagnosis

## 2012-12-22 NOTE — Progress Notes (Signed)
Patient:  [name]   DOB: 06-14-1971  MR Number: [id number]  Location: BEHAVIORAL [facility] [address] [place] [address] Dept: [phone]  Start: 10 AM End: 11 AM  Provider/Observer:     Hershal Coria PSYD  Chief Complaint:      Chief Complaint  Patient presents with  . Anxiety  . Depression  . Stress    Reason For Service:    The patient was referred by a friend for psychotherapeutic interventions do to the development of significant depression, insomnia, weight loss, loss of appetite, anxiety. She has been diagnosed with irritable bowel syndrome as numerous gastrointestinal issues. The patient reports that a major stressor was a loss of her grandmother whom she had been taking care of and was dying and the patient put everything on her life on the back burner to take care of his grandmother. The grandmother, whom she become very close to passed away and she reports that now she feels like a very different person she used to be. She reports this is been going on for several months and has been around for at least 6+ months. The patient reports this is produce a major factor in her life. She reports that she was having significant gastrointestinal difficulties throughout this time but essentially put these issues on the back burner to take care of her grandmother.   Interventions Strategy:  Cognitive/behavioral psychotherapeutic interventions  Participation Level:   Active  Participation Quality:  Appropriate      Behavioral Observation:  Well Groomed, Alert, and Appropriate.   Current Psychosocial Factors: The patient reports that she has continued to do a lot of worrying and stress regarding her grandmother and her grandmother's death.  Content of Session:   Review current symptoms and continued work on therapeutic interventions for issues of depression, anxiety, and IBS.  Current  Status:   The patient reports that she has been improving her diet and doing more physically active things but continues to have significant sleep problems. She does have Ambien for sleep but she is allowed to take every other day but not more frequently.  Patient Progress:   Stable  Target Goals:   Target goals include reducing the intensity, frequency, and duration of symptoms related to depression including feelings of helplessness and hopelessness, insomnia, agitation, and feelings of sadness and loss. He also work on reducing the issues of anxiety and issues related to irritable bowel syndrome.  Last Reviewed:   11/27/2012  Goals Addressed Today:    Today we worked on cognitive/behavioral psychotherapeutic interventions for issues of depression, anxiety, and insomnia.  Impression/Diagnosis:   At this point, the patient has been through a major stressor with taking care of her sick grandmother who would raised her for quite a long time but more recently he the grandmother passed away and she is developed significant depression, insomnia, anxiety, weight loss, loss of appetite, and gastrointestinal difficulties.   Diagnosis:    Axis I:  Major depressive disorder, recurrent episode, moderate  Anxiety state, unspecified  Insomnia  Irritable bowel syndrome      Axis II: No diagnosis

## 2012-12-30 ENCOUNTER — Encounter: Payer: Self-pay | Admitting: Gastroenterology

## 2012-12-31 ENCOUNTER — Encounter: Payer: Self-pay | Admitting: Gastroenterology

## 2012-12-31 ENCOUNTER — Ambulatory Visit (INDEPENDENT_AMBULATORY_CARE_PROVIDER_SITE_OTHER): Payer: BC Managed Care – PPO | Admitting: Gastroenterology

## 2012-12-31 VITALS — BP 107/64 | HR 79 | Temp 97.4°F | Ht 68.0 in | Wt 116.8 lb

## 2012-12-31 DIAGNOSIS — R197 Diarrhea, unspecified: Secondary | ICD-10-CM

## 2012-12-31 MED ORDER — HYOSCYAMINE SULFATE 0.125 MG SL SUBL: SUBLINGUAL_TABLET | SUBLINGUAL | Status: DC

## 2012-12-31 MED ORDER — PROMETHAZINE HCL 12.5 MG PO TABS: ORAL_TABLET | ORAL | Status: DC

## 2012-12-31 NOTE — Assessment & Plan Note (Signed)
GI WORKUP UNREMARKABLE. DIFFERENTIAL DIAGNOSIS INCLUDES ENDOCRINE DISORDER IN LIGHT OF PT'S AMENORRHEA. PT ALSO ALSOP AT RISK FOR OSTEOPOROSIS.  ENDOCRINOLOGY REFERRAL FOR OSTEOPROSIS RISK, DEPRESSION, AMENORRHEA, ANOREXIA, AND WEIGHT LOSS . IF NO ETIOLOGY FOR WEIGHT LOSS, ANOREXIA IDENTIFIED, PT'S SX MOST LIKELY DUE TO DEPRESSION/IBS-D.

## 2012-12-31 NOTE — Patient Instructions (Addendum)
EAT HIGH CALORIE FOODS. SEE INFO BELOW.  TAKE LEVSIN 1-2 SL 30 MINUTES PRIOR TO MEALS AND AT BEDTIME.  CONTINUE PROTONIX.  SEE ENDOCRINOLOGY-DR. NIDA FOR WEIGHT LOSS,LOSS OF APPETITE, AND IRREGULAR MENSTRUAL CYCLE.  FOLLOW UP IN 4 MOS.     High Protein, High Calorie Diet A high protein, high calorie diet increases the amount of protein and calories you eat. You may need more protein and calories in your diet because of illness, surgery, injury, weight loss, or having a poor appetite. Eating high protein and high calorie foods can help you gain weight, heal, and recover after illness.  SERVING SIZES Measuring foods and serving sizes helps to make sure you are getting the right amount of food. The list below tells how big or small some common serving sizes are.   1 oz.........4 stacked dice.  3 oz........Marland KitchenDeck of cards.  1 tsp.......Marland KitchenTip of little finger.  1 tbs......Marland KitchenMarland KitchenThumb.  2 tbs.......Marland KitchenGolf ball.   cup......Marland KitchenHalf of a fist.  1 cup.......Marland KitchenA fist. HIGH PROTEIN FOODS Dairy  Whole milk.  Whole milk yogurt.  Powdered milk.  Cheese.  [employer].  Instant breakfast products.  Eggnog. Tips for adding to your diet:  Use whole milk when making hot cereal, puddings, soups, and hot cocoa.  Add powdered milk to baked goods, smoothies, and milkshakes.  Make whole milk yogurt parfaits by adding granola, fruit, or nuts.  Add cheese to sandwiches, pastas, soups, and casseroles.  Add fruit to cottage cheese. Meat   Beef, pork, and poultry.  Fish and seafood.  Peanut butter.  Dried beans.  Eggs. Tips for adding to your diet:  Make meat and cheese omelets.  Add eggs to salads and baked goods.  Add fish and seafood to salads.  Add meat and poultry to casseroles, salads, and soups.  Use peanut butter as a topping for pretzels, celery, crackers, or add it to baked goods.  Use beans in casseroles, dips, and spreads. GENERAL GUIDELINES TO INCREASE  CALORIES  Replace calorie-free drinks with calorie-containing drinks, such as milk, fruit juices, regular soda, milkshakes, and hot chocolate.  Try to eat 6 small meals instead of 3 large meals each day.  Keep snacks handy, such as nuts, trail mixes, dried fruit, and yogurt.  Choose foods with sauces and gravies.  Add dried fruits, honey, and half-and-half to hot or cold cereal.  Add extra fats when possible, such as butter, sour cream, cream cheese, and salad dressings.  Add cheese to foods often.  Consider adding a clear liquid nutritional supplement to your diet. Your caregiver can give you recommendations. HIGH CALORIE FOODS Grain/Starch  Baked goods, such as muffins and quick breads.  Croissants.  Pancakes and waffles. Vegetable   Sauted vegetables in oil.  Fried vegetables.  Salad greens with regular salad dressing or vinegar and oil. Fruit  Dried fruit.  Canned fruit in syrup.  Fruit juice. Fat  Avocado.  Butter or margarine.  Whipped cream.  Mayonnaise.  Salad dressing.  Peanuts and mixed nuts.  Cream cheese and sour cream. Sweets and Dessert  Cake.  Cookies.  Pie.  Ice cream.  Doughnuts and pastries.  Protein and meal replacement bars.  Jam, preserves, and jelly.  Candy bars.  Chocolate.  Chocolate, caramel, or other flavored syrups. Document Released: 10/22/2005 Document Revised: 01/14/2012 Document Reviewed: 07/25/2007 [facility] Patient Information 2013 [place], [place]. Referral has been faxed to Dr. Fransico Him and his office will contact the patient with date & time

## 2012-12-31 NOTE — Progress Notes (Signed)
Faxed to PCP
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
## 2012-12-31 NOTE — Assessment & Plan Note (Signed)
WEIGHT STABLE BUT BMI STILL LESS THAN 20.  EAT A HIGH CALORIE DIET. HO GIVEN.

## 2012-12-31 NOTE — Assessment & Plan Note (Addendum)
Most likely due to IBS-D. SX WORSE OFF Levsin.pt taking Bentyl & Levsin.  STOP BENTYL. USE LEVSIN 1-2 QAC/HS. IF SX NOT IMPROVED AFTER NEXT VISIT, CONSIDER LOTRENEX. EAT HIGH CALORIE FOODS OPV IN 4 MOS

## 2012-12-31 NOTE — Assessment & Plan Note (Signed)
ETIOLOGY UNCLEAR-PSYCHOSOMATIC SX?  PHENERGAN/ZOFRAN AS NEEDED.  CAUTIONED PT ABOUT SEDATING SIDE EFFECTS, WHICH MAY BE EXACERBATED BY AMBIEN

## 2013-01-02 NOTE — Progress Notes (Signed)
Reminder in epic to follow up in 4 montsh

## 2013-01-13 ENCOUNTER — Ambulatory Visit (HOSPITAL_COMMUNITY): Payer: Self-pay | Admitting: Psychology

## 2013-01-21 ENCOUNTER — Ambulatory Visit (INDEPENDENT_AMBULATORY_CARE_PROVIDER_SITE_OTHER): Payer: BC Managed Care – PPO | Admitting: Psychiatry

## 2013-01-21 ENCOUNTER — Encounter (HOSPITAL_COMMUNITY): Payer: Self-pay | Admitting: Psychiatry

## 2013-01-21 VITALS — Wt 116.2 lb

## 2013-01-21 DIAGNOSIS — M79609 Pain in unspecified limb: Secondary | ICD-10-CM | POA: Insufficient documentation

## 2013-01-21 MED ORDER — GABAPENTIN 100 MG PO CAPS: ORAL_CAPSULE | ORAL | Status: DC

## 2013-01-21 MED ORDER — ZOLPIDEM TARTRATE 5 MG PO TABS: 5.0000 mg | ORAL_TABLET | Freq: Every evening | ORAL | Status: DC | PRN

## 2013-01-21 MED ORDER — DULOXETINE HCL 20 MG PO CPEP: 20.0000 mg | ORAL_CAPSULE | Freq: Two times a day (BID) | ORAL | Status: DC

## 2013-01-21 NOTE — Patient Instructions (Signed)
Relaxation is the ultimate solution for you.  You can seek it through tub baths, bubble baths, essential oils or incense, walking or chatting with friends, listening to soft music, watching a candle burn and just letting all thoughts go and appreciating the true essence of the Creator.   Yoga is a very helpful exercise method.  On TV, on line, or by DVD Adelfa Koh is a source of high quality information about yoga and videos on yoga.  Renee Ramus is the world's number one video yoga instructor according to some experts.  There are exceptional health benefits that can be achieved through yoga.  The main principles of yoga is acceptance, no competition, no comparison, and no judgement.  It is exceptional in helping people meditate and get to a very relaxed state.   STOP LEXAPRO  Call if problems or concerns.
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
## 2013-01-22 ENCOUNTER — Ambulatory Visit (INDEPENDENT_AMBULATORY_CARE_PROVIDER_SITE_OTHER): Payer: BC Managed Care – PPO | Admitting: Psychology

## 2013-01-22 DIAGNOSIS — F331 Major depressive disorder, recurrent, moderate: Secondary | ICD-10-CM

## 2013-01-22 DIAGNOSIS — F411 Generalized anxiety disorder: Secondary | ICD-10-CM

## 2013-01-22 DIAGNOSIS — G4700 Insomnia, unspecified: Secondary | ICD-10-CM

## 2013-01-22 DIAGNOSIS — K589 Irritable bowel syndrome without diarrhea: Secondary | ICD-10-CM

## 2013-02-10 ENCOUNTER — Other Ambulatory Visit (HOSPITAL_COMMUNITY): Payer: Self-pay | Admitting: "Endocrinology

## 2013-02-10 DIAGNOSIS — Z139 Encounter for screening, unspecified: Secondary | ICD-10-CM

## 2013-02-12 ENCOUNTER — Other Ambulatory Visit: Payer: Self-pay | Admitting: Adult Health

## 2013-02-12 ENCOUNTER — Ambulatory Visit (HOSPITAL_COMMUNITY): Payer: Self-pay | Admitting: Psychology

## 2013-02-12 NOTE — Progress Notes (Signed)
Patient:  [name]   DOB: 1970-12-13  MR Number: [id number]  Location: [facility] [address] [place] [address] Dept: [phone]  Start: 10 AM End: 11 AM  Provider/Observer:     Hershal Coria PSYD  Chief Complaint:      Chief Complaint  Patient presents with  . Anxiety  . Depression    Reason For Service:    The patient was referred by a friend for psychotherapeutic interventions do to the development of significant depression, insomnia, weight loss, loss of appetite, anxiety. She has been diagnosed with irritable bowel syndrome as numerous gastrointestinal issues. The patient reports that a major stressor was a loss of her grandmother whom she had been taking care of and was dying and the patient put everything on her life on the back burner to take care of his grandmother. The grandmother, whom she become very close to passed away and she reports that now she feels like a very different person she used to be. She reports this is been going on for several months and has been around for at least 6+ months. The patient reports this is produce a major factor in her life. She reports that she was having significant gastrointestinal difficulties throughout this time but essentially put these issues on the back burner to take care of her grandmother.   Interventions Strategy:  Cognitive/behavioral psychotherapeutic interventions  Participation Level:   Active  Participation Quality:  Appropriate      Behavioral Observation:  Well Groomed, Alert, and Appropriate.   Current Psychosocial Factors: The patient reports that she is dealing a little bit better with issues around a loss of her grandmother but she continues to have difficulty with her appetite and losing weight. The patient reports that issues of depression have improved somewhat and she has been actively working on behavioral  interventions.  Content of Session:   Review current symptoms and continued work on therapeutic interventions for issues of depression, anxiety, and IBS.  Current Status:   The patient reports that she is still working on issues of her diet and they have been improving a great deal. While her appetite continues to be quite low the quality of foods that she is eating is better. She has been working on exercise regimen as well as well as getting out and interact with people.  Patient Progress:   Stable  Target Goals:   Target goals include reducing the intensity, frequency, and duration of symptoms related to depression including feelings of helplessness and hopelessness, insomnia, agitation, and feelings of sadness and loss. He also work on reducing the issues of anxiety and issues related to irritable bowel syndrome.  Last Reviewed:   01/22/2013  Goals Addressed Today:    Today we worked on cognitive/behavioral psychotherapeutic interventions for issues of depression, anxiety, and insomnia.  Impression/Diagnosis:   At this point, the patient has been through a major stressor with taking care of her sick grandmother who would raised her for quite a long time but more recently he the grandmother passed away and she is developed significant depression, insomnia, anxiety, weight loss, loss of appetite, and gastrointestinal difficulties.   Diagnosis:    Axis I:  Major depressive disorder, recurrent episode, moderate  Anxiety state, unspecified  Insomnia  Irritable bowel syndrome      Axis II: No diagnosis
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
## 2013-02-16 ENCOUNTER — Ambulatory Visit (HOSPITAL_COMMUNITY)
Admission: RE | Admit: 2013-02-16 | Discharge: 2013-02-16 | Disposition: A | Discharge status: Home / Self Care | Payer: BC Managed Care – PPO | Source: Ambulatory Visit | Attending: "Endocrinology | Admitting: "Endocrinology

## 2013-02-16 DIAGNOSIS — M899 Disorder of bone, unspecified: Secondary | ICD-10-CM | POA: Insufficient documentation

## 2013-02-16 DIAGNOSIS — Z13820 Encounter for screening for osteoporosis: Secondary | ICD-10-CM | POA: Insufficient documentation

## 2013-02-16 DIAGNOSIS — M949 Disorder of cartilage, unspecified: Secondary | ICD-10-CM | POA: Insufficient documentation

## 2013-02-16 DIAGNOSIS — Z139 Encounter for screening, unspecified: Secondary | ICD-10-CM

## 2013-02-17 ENCOUNTER — Encounter: Payer: Self-pay | Admitting: *Deleted

## 2013-02-18 ENCOUNTER — Ambulatory Visit: Payer: Self-pay | Admitting: Adult Health

## 2013-02-23 ENCOUNTER — Other Ambulatory Visit: Payer: Self-pay | Admitting: Gastroenterology

## 2013-02-24 ENCOUNTER — Encounter (HOSPITAL_COMMUNITY): Payer: Self-pay | Admitting: Psychiatry

## 2013-02-24 ENCOUNTER — Ambulatory Visit (INDEPENDENT_AMBULATORY_CARE_PROVIDER_SITE_OTHER): Payer: BC Managed Care – PPO | Admitting: Psychiatry

## 2013-02-24 VITALS — Wt 114.0 lb

## 2013-02-24 DIAGNOSIS — F418 Other specified anxiety disorders: Secondary | ICD-10-CM

## 2013-02-24 DIAGNOSIS — F332 Major depressive disorder, recurrent severe without psychotic features: Secondary | ICD-10-CM

## 2013-02-24 DIAGNOSIS — M79609 Pain in unspecified limb: Secondary | ICD-10-CM

## 2013-02-24 DIAGNOSIS — F489 Nonpsychotic mental disorder, unspecified: Secondary | ICD-10-CM

## 2013-02-24 DIAGNOSIS — R634 Abnormal weight loss: Secondary | ICD-10-CM

## 2013-02-24 DIAGNOSIS — F411 Generalized anxiety disorder: Secondary | ICD-10-CM

## 2013-02-24 DIAGNOSIS — F5105 Insomnia due to other mental disorder: Secondary | ICD-10-CM

## 2013-02-24 DIAGNOSIS — E559 Vitamin D deficiency, unspecified: Secondary | ICD-10-CM

## 2013-02-24 MED ORDER — ZOLPIDEM TARTRATE 5 MG PO TABS: 5.0000 mg | ORAL_TABLET | Freq: Every evening | ORAL | Status: DC | PRN

## 2013-02-24 MED ORDER — GABAPENTIN 100 MG PO CAPS: ORAL_CAPSULE | ORAL | Status: DC

## 2013-02-24 MED ORDER — DULOXETINE HCL 30 MG PO CPEP: 30.0000 mg | ORAL_CAPSULE | Freq: Two times a day (BID) | ORAL | Status: DC
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
## 2013-02-24 NOTE — Patient Instructions (Signed)
Talk to Dr Irven Shelling about CBT for insomnia possibly helping you along with the increased Neurontin.  Get repeat Vitamin D level  Set a timer for 8 or a certain number minutes and walk for that amount of time in the house or in the yard.  Mark the number of minutes on a calendar for that day.  Do that every day this week.  Then next week increase the time by 1 minutes and then mark the calendar with the number of minutes for that day.  Each week increase your exercise by one minute.  Keep a record of this so you can see the progress you are making.  Do this every day, just like eating and sleeping.  It is good for pain control, depression, and for your soul/spirit.  Bring the record in for your next visit so we can talk about your effort and how you feel with the new exercise program going and working for you.  Relaxation is the ultimate solution for you.  You can seek it through tub baths, bubble baths, essential oils or incense, walking or chatting with friends, listening to soft music, watching a candle burn and just letting all thoughts go and appreciating the true essence of the Creator.  Pets or animals may be very helpful.  You might spend some time with them and then go do more directed meditation.  Take care of yourself.  No one else is standing up to do the job and only you know what you need.   GET SERIOUS about taking care of yourself.  Do the next right thing and that often means doing something to care for yourself along the lines of are you hungry, are you angry, are you lonely, are you tired, are you scared?  HALTS is what that stands for.  Call if problems or concerns.  Check with your doctor about the cold hands

## 2013-03-03 ENCOUNTER — Encounter (HOSPITAL_COMMUNITY): Payer: Self-pay | Admitting: Psychology

## 2013-03-03 ENCOUNTER — Ambulatory Visit (INDEPENDENT_AMBULATORY_CARE_PROVIDER_SITE_OTHER): Payer: BC Managed Care – PPO | Admitting: Psychology

## 2013-03-03 DIAGNOSIS — F411 Generalized anxiety disorder: Secondary | ICD-10-CM

## 2013-03-03 DIAGNOSIS — F331 Major depressive disorder, recurrent, moderate: Secondary | ICD-10-CM

## 2013-03-03 DIAGNOSIS — K589 Irritable bowel syndrome without diarrhea: Secondary | ICD-10-CM

## 2013-03-03 DIAGNOSIS — G4700 Insomnia, unspecified: Secondary | ICD-10-CM

## 2013-03-03 NOTE — Progress Notes (Signed)
Patient:  [name]   DOB: 08-22-1971  MR Number: [id number]  Location: [facility] [address] [place] [address] Dept: [phone]  Start: 10 AM End: 11 AM  Provider/Observer:     Hershal Coria PSYD  Chief Complaint:      Chief Complaint  Patient presents with  . Anxiety  . Depression    Reason For Service:    The patient was referred by a friend for psychotherapeutic interventions do to the development of significant depression, insomnia, weight loss, loss of appetite, anxiety. She has been diagnosed with irritable bowel syndrome as numerous gastrointestinal issues. The patient reports that a major stressor was a loss of her grandmother whom she had been taking care of and was dying and the patient put everything on her life on the back burner to take care of his grandmother. The grandmother, whom she become very close to passed away and she reports that now she feels like a very different person she used to be. She reports this is been going on for several months and has been around for at least 6+ months. The patient reports this is produce a major factor in her life. She reports that she was having significant gastrointestinal difficulties throughout this time but essentially put these issues on the back burner to take care of her grandmother.   Interventions Strategy:  Cognitive/behavioral psychotherapeutic interventions  Participation Level:   Active  Participation Quality:  Appropriate      Behavioral Observation:  Well Groomed, Alert, and Appropriate.   Current Psychosocial Factors: The patient reports that she has been coping much better with the reality of her grandmother's death. The patient reports that while this has gotten better she has been more worried about her on weight loss and her deteriorating medical function.  Content of Session:   Review current symptoms  and continued work on therapeutic interventions for issues of depression, anxiety, and IBS.  Current Status:   . the patient reports that while she is continuing to work on issues related to her weight loss, depression, and anxiety she continues to lose weight  Patient Progress:   Stable  Target Goals:   Target goals include reducing the intensity, frequency, and duration of symptoms related to depression including feelings of helplessness and hopelessness, insomnia, agitation, and feelings of sadness and loss. He also work on reducing the issues of anxiety and issues related to irritable bowel syndrome.  Last Reviewed:   03/03/2013  Goals Addressed Today:    Today we worked on cognitive/behavioral psychotherapeutic interventions for issues of depression, anxiety, and insomnia.  Impression/Diagnosis:   At this point, the patient has been through a major stressor with taking care of her sick grandmother who would raised her for quite a long time but more recently he the grandmother passed away and she is developed significant depression, insomnia, anxiety, weight loss, loss of appetite, and gastrointestinal difficulties.   Diagnosis:    Axis I:  Major depressive disorder, recurrent episode, moderate  Anxiety state, unspecified  Insomnia  Irritable bowel syndrome      Axis II: No diagnosis

## 2013-04-08 ENCOUNTER — Other Ambulatory Visit: Payer: Self-pay | Admitting: Adult Health

## 2013-04-22 ENCOUNTER — Encounter: Payer: Self-pay | Admitting: Gastroenterology

## 2013-06-07 ENCOUNTER — Other Ambulatory Visit: Payer: Self-pay | Admitting: Adult Health

## 2013-06-14 ENCOUNTER — Other Ambulatory Visit (HOSPITAL_COMMUNITY): Payer: Self-pay | Admitting: Psychiatry

## 2013-07-08 ENCOUNTER — Telehealth: Payer: Self-pay | Admitting: Adult Health

## 2013-07-08 NOTE — Telephone Encounter (Signed)
Complains of sneezing and eyes running try Claritin, or zyrtec or even benadryl if eyes continue see eye doctor.

## 2013-08-17 ENCOUNTER — Other Ambulatory Visit: Payer: Self-pay | Admitting: Adult Health

## 2013-09-08 ENCOUNTER — Other Ambulatory Visit: Payer: Self-pay | Admitting: Adult Health

## 2013-09-10 ENCOUNTER — Other Ambulatory Visit: Payer: Self-pay

## 2013-09-29 ENCOUNTER — Other Ambulatory Visit: Payer: Self-pay | Admitting: Adult Health

## 2013-10-05 ENCOUNTER — Encounter: Payer: Self-pay | Admitting: Adult Health

## 2013-10-05 ENCOUNTER — Ambulatory Visit (INDEPENDENT_AMBULATORY_CARE_PROVIDER_SITE_OTHER): Payer: BC Managed Care – PPO | Admitting: Adult Health

## 2013-10-05 VITALS — BP 100/70 | HR 74 | Ht 68.0 in | Wt 119.5 lb

## 2013-10-05 DIAGNOSIS — G43909 Migraine, unspecified, not intractable, without status migrainosus: Secondary | ICD-10-CM

## 2013-10-05 DIAGNOSIS — Z1212 Encounter for screening for malignant neoplasm of rectum: Secondary | ICD-10-CM

## 2013-10-05 DIAGNOSIS — Z3041 Encounter for surveillance of contraceptive pills: Secondary | ICD-10-CM | POA: Insufficient documentation

## 2013-10-05 DIAGNOSIS — Z309 Encounter for contraceptive management, unspecified: Secondary | ICD-10-CM

## 2013-10-05 DIAGNOSIS — Z01419 Encounter for gynecological examination (general) (routine) without abnormal findings: Secondary | ICD-10-CM

## 2013-10-05 HISTORY — DX: Encounter for contraceptive management, unspecified: Z30.9

## 2013-10-05 LAB — HEMOCCULT GUIAC POC 1CARD (OFFICE)

## 2013-10-05 NOTE — Patient Instructions (Signed)
Physical in 1 year Mammogram now and yearly   [phone] Follow up prn
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
## 2013-10-07 ENCOUNTER — Other Ambulatory Visit: Payer: Self-pay | Admitting: Adult Health

## 2013-11-10 ENCOUNTER — Other Ambulatory Visit: Payer: Self-pay | Admitting: Adult Health

## 2013-11-10 MED ORDER — CITALOPRAM HYDROBROMIDE 40 MG PO TABS: ORAL_TABLET | ORAL | Status: DC

## 2014-04-29 ENCOUNTER — Other Ambulatory Visit: Payer: Self-pay | Admitting: Adult Health

## 2014-04-29 MED ORDER — NORETHIN-ETH ESTRAD-FE BIPHAS 1 MG-10 MCG / 10 MCG PO TABS: 1.0000 | ORAL_TABLET | Freq: Every day | ORAL | Status: DC

## 2014-04-30 IMAGING — CT CT ABD-PELV W/ CM
2 of 4 series · 16 of 46 positions shown, 18 images · IV contrast (omnipaque)
Comparison: Hepatobiliary nuclear medicine study of 08/30/2009.  CT
of 08/27/2009.  Ultrasound 08/27/2009.

CLINICAL DATA: Abdominal pain with nausea diarrhea.  History of
gallbladder surgery.

CT ABDOMEN AND PELVIS WITH CONTRAST
TECHNIQUE: Multidetector CT imaging of the abdomen and pelvis was
performed following the standard protocol during bolus
administration of intravenous contrast.
Contrast: 100mL OMNIPAQUE IOHEXOL 300 MG/ML  SOLN

[Series 2: abd_pel_with 5.0 b40f · axial · 0.59mm/px · z∈[-464,-84]mm · 13 of 86 slices shown, 15 images]
[im 5/86  soft-tissue]
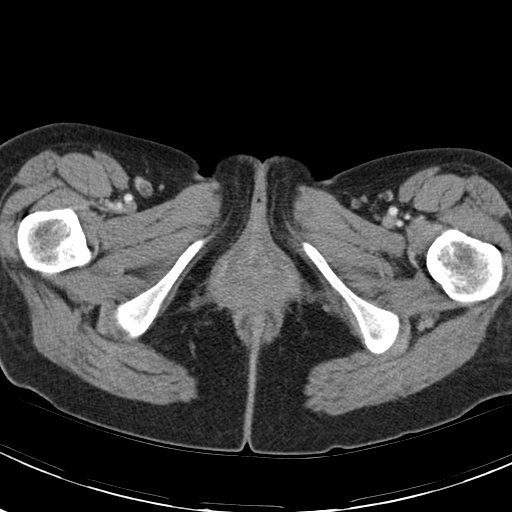
[im 5/86  bone]
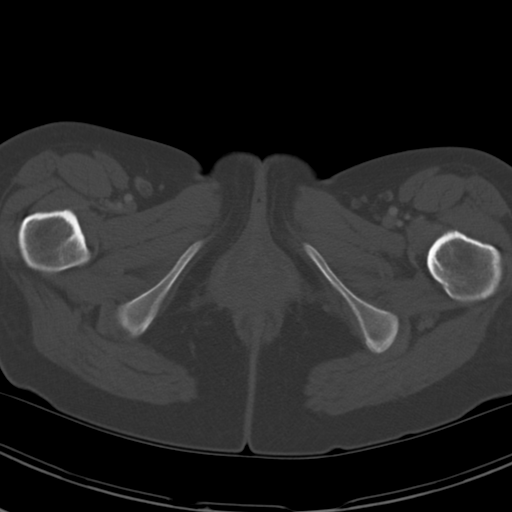
[im 13/86  soft-tissue]
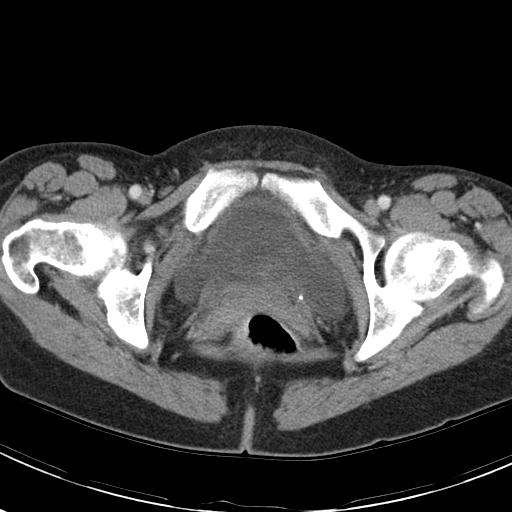
[im 17/86  soft-tissue]
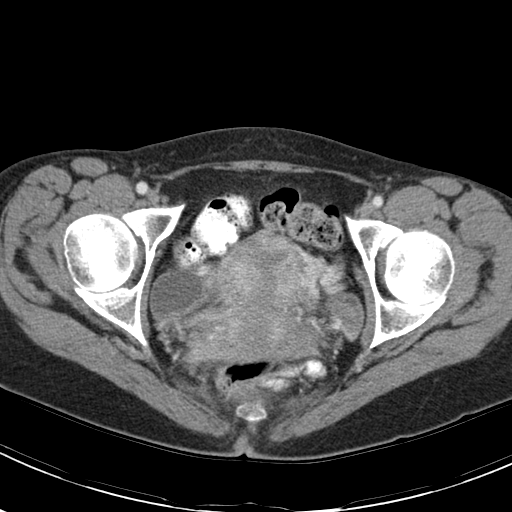
[im 25/86  soft-tissue]
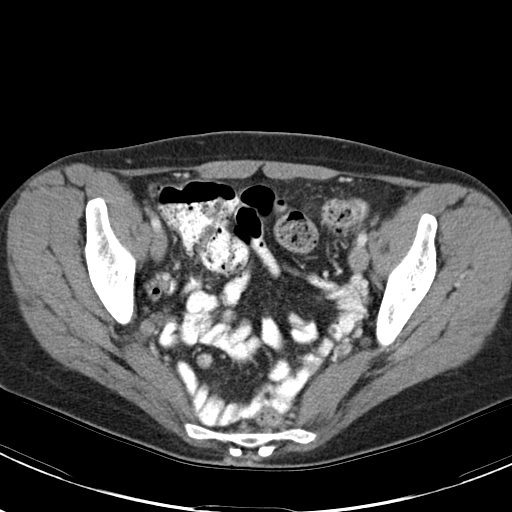
[im 29/86  soft-tissue]
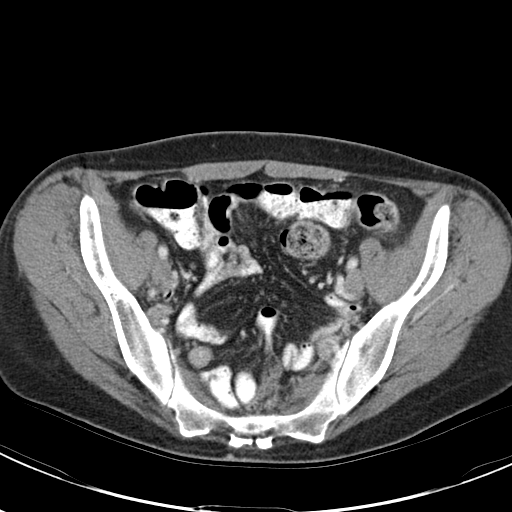
[im 37/86  soft-tissue]
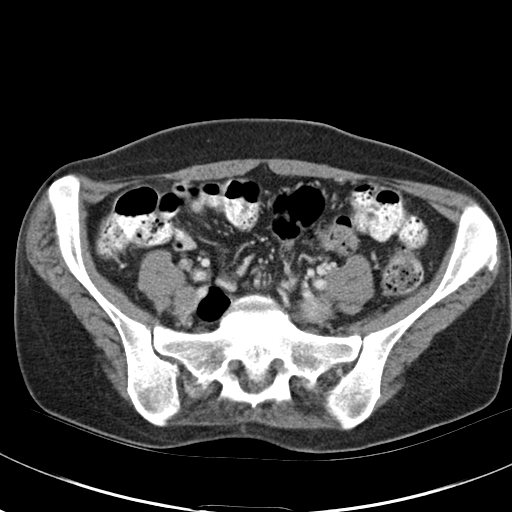
[im 45/86  soft-tissue]
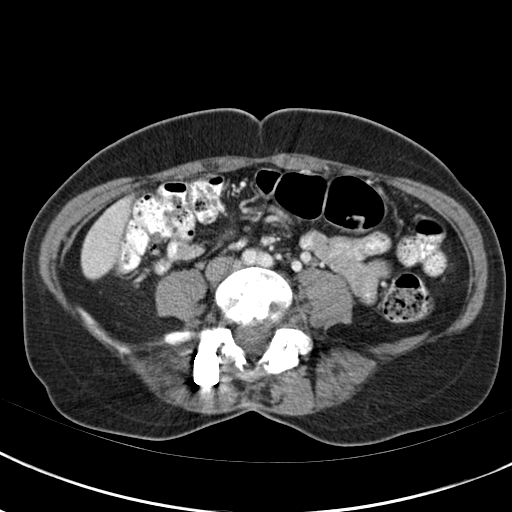
[im 49/86  soft-tissue]
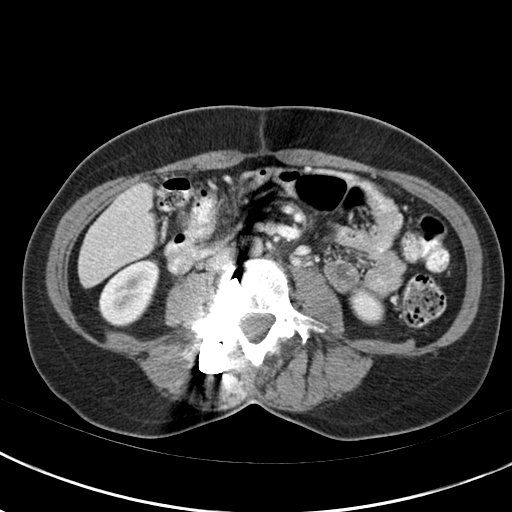
[im 57/86  soft-tissue]
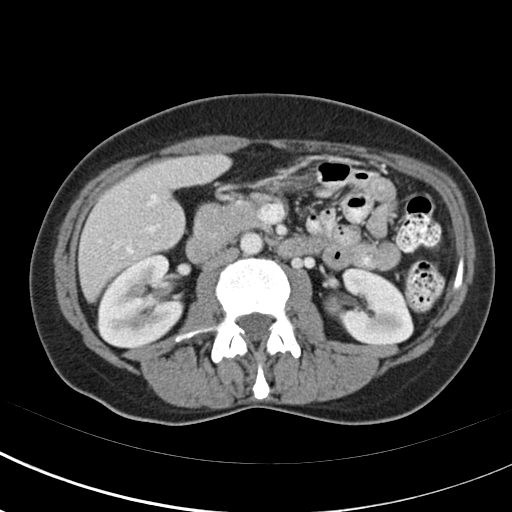
[im 57/86  bone]
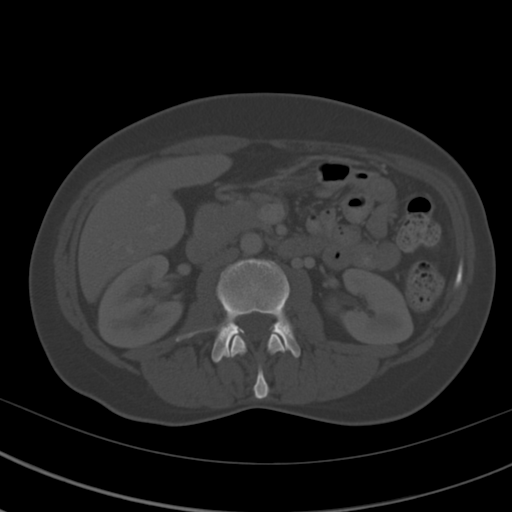
[im 61/86  soft-tissue]
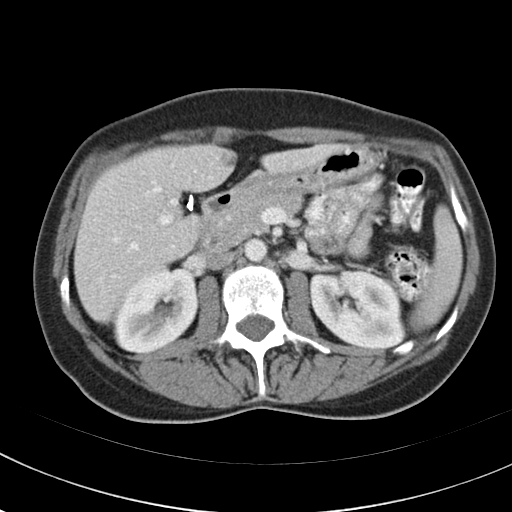
[im 69/86  soft-tissue]
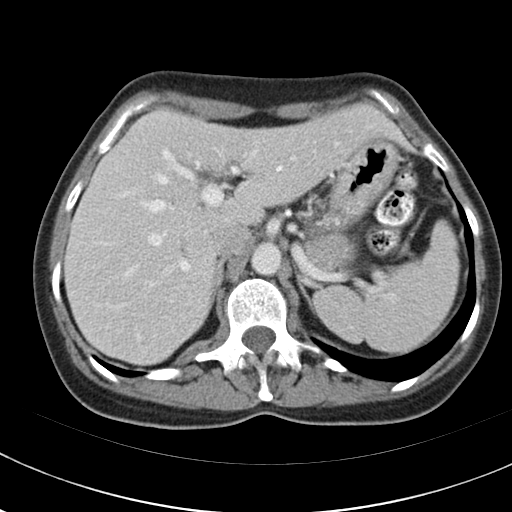
[im 73/86  soft-tissue]
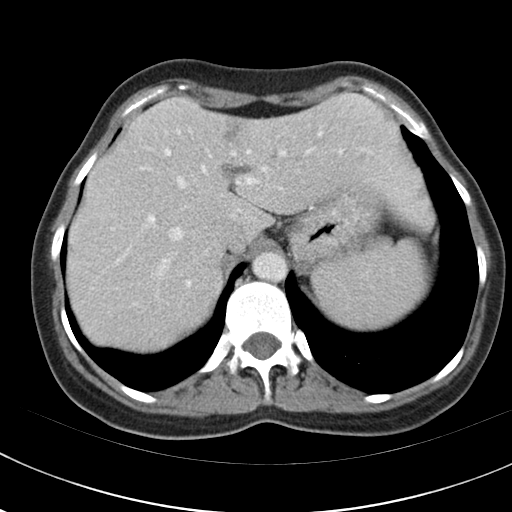
[im 81/86  soft-tissue]
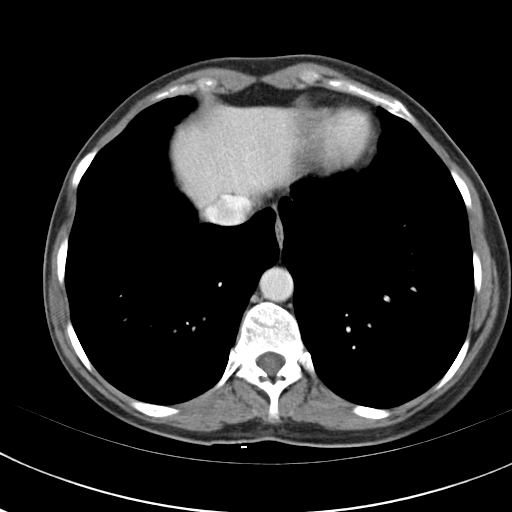

[Series 4: abd_pel_with 3.0 spo cor · coronal · 0.67mm/px · 3 of 59 slices shown]
[im 20/59  soft-tissue]
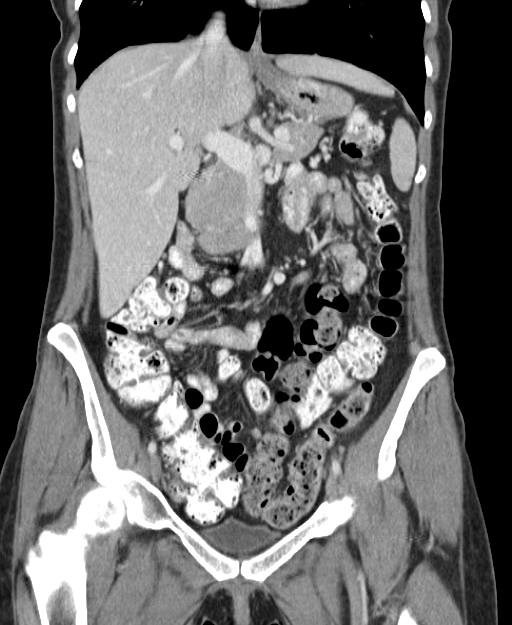
[im 26/59  soft-tissue]
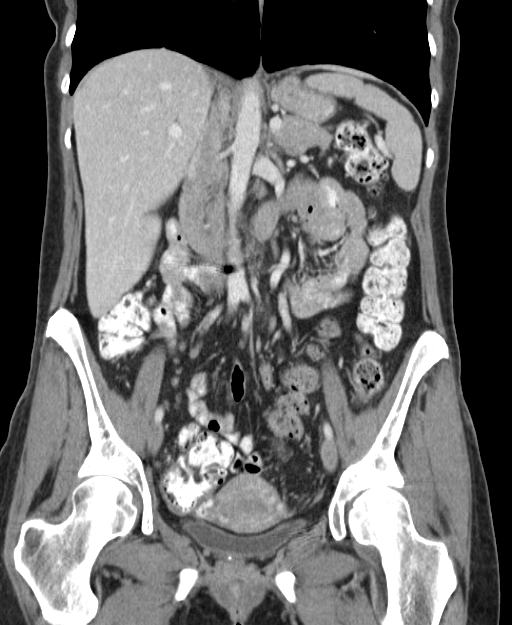
[im 33/59  soft-tissue]
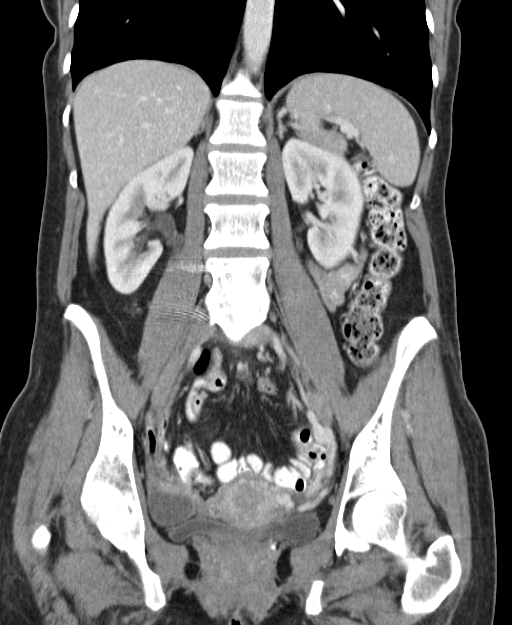

[16 of 46 positions shown; findings below may reference images not displayed]

FINDINGS: Lung bases:  Clear lung bases.  Normal heart size without
pericardial or pleural effusion.

Abdomen/pelvis:  Borderline enlarged right lobe of the liver,
cm.  Focal steatosis adjacent the falciform ligament and within the
posterior aspect of segment 4.  Normal spleen.  Portions of the
proximal stomach underdistended.  Normal pancreas. Cholecystectomy
without biliary ductal dilatation.

Normal adrenal glands and kidneys.
No retroperitoneal or retrocrural adenopathy.

Moderate amount of stool within the colon.  The cecum is positioned
within the high pelvis. Normal terminal ileum and appendix.  Normal
small bowel without abdominal ascites.

  No pelvic adenopathy.  Normal urinary bladder and uterus.  Mild
prominence of the left gonadal vein.  Dominant follicle within the
right ovary at 2.4 cm. No significant free fluid.

Bones/Musculoskeletal:  L4-L5 posterior fixation.
IMPRESSION: 1. No acute process in the abdomen or pelvis.
2.  Prominent left gonadal veins, as can be seen with pelvic
congestion syndrome.

## 2014-09-06 ENCOUNTER — Encounter: Payer: Self-pay | Admitting: Adult Health

## 2014-10-07 ENCOUNTER — Telehealth: Payer: Self-pay | Admitting: Adult Health

## 2014-10-07 MED ORDER — CITALOPRAM HYDROBROMIDE 40 MG PO TABS: ORAL_TABLET | ORAL | Status: DC

## 2014-10-07 NOTE — Telephone Encounter (Signed)
Wants celexa refilled at [facility] in [place] its $4 list there, will do

## 2014-11-30 ENCOUNTER — Telehealth: Payer: Self-pay | Admitting: *Deleted

## 2014-11-30 NOTE — Telephone Encounter (Signed)
Wants samples of lo loestrin

## 2015-03-04 ENCOUNTER — Telehealth: Payer: Self-pay | Admitting: *Deleted

## 2015-03-04 NOTE — Telephone Encounter (Signed)
Pt requesting samples of Lo Loestrin. I spoke with JAG and she advised to give 2 sample boxes of Lo Loestrin. #[id number] A exp 1/17. JSY

## 2015-04-26 ENCOUNTER — Telehealth: Payer: Self-pay | Admitting: *Deleted

## 2015-04-26 MED ORDER — NORETHIN-ETH ESTRAD-FE BIPHAS 1 MG-10 MCG / 10 MCG PO TABS: 1.0000 | ORAL_TABLET | Freq: Every day | ORAL | Status: DC

## 2015-04-26 NOTE — Telephone Encounter (Signed)
Will get samples

## 2015-04-26 NOTE — Telephone Encounter (Signed)
Will give samples

## 2015-04-26 NOTE — Telephone Encounter (Signed)
Pt requesting samples on Lo loestrin.

## 2015-06-02 DIAGNOSIS — Z029 Encounter for administrative examinations, unspecified: Secondary | ICD-10-CM

## 2015-08-05 ENCOUNTER — Other Ambulatory Visit: Payer: Self-pay | Admitting: *Deleted

## 2015-08-05 MED ORDER — NORETHIN-ETH ESTRAD-FE BIPHAS 1 MG-10 MCG / 10 MCG PO TABS: 1.0000 | ORAL_TABLET | Freq: Every day | ORAL | Status: DC

## 2015-10-12 ENCOUNTER — Telehealth: Payer: Self-pay | Admitting: *Deleted

## 2015-10-12 NOTE — Telephone Encounter (Signed)
Spoke with pt letting her know we had 2 sample boxes of Lo Loestrin. Lot # [id number] A exp. 7/17. JAG advised she needs to be seen. Pt states she has no insurance. Pt to talk to Geraldine Contrasammy Mitchell and see what she advises. JSY

## 2015-10-12 NOTE — Telephone Encounter (Signed)
Pt requesting lo loestrin samples.

## 2015-10-24 ENCOUNTER — Telehealth: Payer: Self-pay | Admitting: *Deleted

## 2015-10-24 ENCOUNTER — Other Ambulatory Visit: Payer: Self-pay | Admitting: Adult Health

## 2015-10-25 ENCOUNTER — Telehealth: Payer: Self-pay | Admitting: Obstetrics & Gynecology

## 2015-10-25 MED ORDER — CITALOPRAM HYDROBROMIDE 40 MG PO TABS
40.0000 mg | ORAL_TABLET | Freq: Every day | ORAL | Status: DC
Start: 2015-10-25 — End: 2016-01-05

## 2015-10-25 MED ORDER — CITALOPRAM HYDROBROMIDE 40 MG PO TABS: ORAL_TABLET | ORAL | Status: DC

## 2015-10-25 NOTE — Telephone Encounter (Signed)
Will refill celexa

## 2015-12-29 ENCOUNTER — Other Ambulatory Visit: Payer: Self-pay | Admitting: Adult Health

## 2016-01-03 ENCOUNTER — Other Ambulatory Visit: Payer: Self-pay | Admitting: Adult Health

## 2016-01-05 ENCOUNTER — Telehealth: Payer: Self-pay | Admitting: Adult Health

## 2016-01-05 ENCOUNTER — Ambulatory Visit (INDEPENDENT_AMBULATORY_CARE_PROVIDER_SITE_OTHER): Payer: Self-pay | Admitting: Adult Health

## 2016-01-05 ENCOUNTER — Other Ambulatory Visit (HOSPITAL_COMMUNITY)
Admission: RE | Admit: 2016-01-05 | Discharge: 2016-01-05 | Disposition: A | Discharge status: Home / Self Care | Payer: Self-pay | Source: Ambulatory Visit | Attending: Adult Health | Admitting: Adult Health

## 2016-01-05 ENCOUNTER — Encounter: Payer: Self-pay | Admitting: Adult Health

## 2016-01-05 VITALS — BP 110/78 | HR 68 | Ht 68.25 in | Wt 138.0 lb

## 2016-01-05 DIAGNOSIS — G43009 Migraine without aura, not intractable, without status migrainosus: Secondary | ICD-10-CM

## 2016-01-05 DIAGNOSIS — Z01419 Encounter for gynecological examination (general) (routine) without abnormal findings: Secondary | ICD-10-CM | POA: Insufficient documentation

## 2016-01-05 DIAGNOSIS — Z1212 Encounter for screening for malignant neoplasm of rectum: Secondary | ICD-10-CM

## 2016-01-05 DIAGNOSIS — Z1151 Encounter for screening for human papillomavirus (HPV): Secondary | ICD-10-CM | POA: Insufficient documentation

## 2016-01-05 DIAGNOSIS — Z3041 Encounter for surveillance of contraceptive pills: Secondary | ICD-10-CM

## 2016-01-05 DIAGNOSIS — F418 Other specified anxiety disorders: Secondary | ICD-10-CM

## 2016-01-05 LAB — HEMOCCULT GUIAC POC 1CARD (OFFICE): FECAL OCCULT BLD: NEGATIVE

## 2016-01-05 MED ORDER — NORETHIN-ETH ESTRAD-FE BIPHAS 1 MG-10 MCG / 10 MCG PO TABS: 1.0000 | ORAL_TABLET | Freq: Every day | ORAL | Status: DC

## 2016-01-05 MED ORDER — CITALOPRAM HYDROBROMIDE 40 MG PO TABS: 40.0000 mg | ORAL_TABLET | Freq: Every day | ORAL | Status: DC

## 2016-01-05 MED ORDER — RIZATRIPTAN BENZOATE 10 MG PO TABS: 10.0000 mg | ORAL_TABLET | ORAL | Status: DC | PRN

## 2016-01-05 NOTE — Telephone Encounter (Signed)
Wants maxalt sent to [facility]

## 2016-01-05 NOTE — Patient Instructions (Signed)
Physical in 1 year, pap in 3 Mammogram advised

## 2016-01-05 NOTE — Progress Notes (Signed)
Patient ID: [name], female   DOB: 12-Mar-1971, 44 y.o.   MRN: [id number] History of Present Illness: Sundra is a 45 year old white female, married, G2P2, in for well woman gyn exam and pap.She is happy with her celexa and is having occasional migraine, not has bad as they used to be.Still has diarrhea after eating, Dr Darrick Penna told her probably IBS.   Current Medications, Allergies, Past Medical History, Past Surgical History, Family History and Social History were reviewed in [employer] record.     Review of Systems: Patient denies any  Daily headaches, hearing loss, fatigue, blurred vision, shortness of breath, chest pain, abdominal pain, problems with  urination, or intercourse. No joint pain or mood swings.See HPI for positives.    Physical Exam:BP 110/78 mmHg  Pulse 68  Ht 5' 8.25" (1.734 m)  Wt 138 lb (62.596 kg)  BMI 20.82 kg/m2 General:  Well developed, well nourished, no acute distress Skin:  Warm and dry Neck:  Midline trachea, normal thyroid, good ROM, no lymphadenopathy Lungs; Clear to auscultation bilaterally Breast:  No dominant palpable mass, retraction, or nipple discharge Cardiovascular: Regular rate and rhythm Abdomen:  Soft, non tender, no hepatosplenomegaly Pelvic:  External genitalia is normal in appearance, no lesions.  The vagina is normal in appearance. Urethra has no lesions or masses. The cervix is bulbous, pap with HPV performed.  Uterus is felt to be normal size, shape, and contour.  No adnexal masses or tenderness noted.Bladder is non tender, no masses felt. Rectal: Good sphincter tone, no polyps, or hemorrhoids felt.  Hemoccult negative. Extremities/musculoskeletal:  No swelling or varicosities noted, no clubbing or cyanosis Psych:  No mood changes, alert and cooperative,seems happy   Impression: Well woman gyn exam and pap Contraceptive management Depression and anxiety Migraines     Plan: Refilled lo loestrin x 1  year, take 1 daily, disp 1 pack, 3 samples given Mammogram advised,try to get scholarship for free one Rx maxalt 10 mg #10 with 1 refill Refilled celexa 40 mg #30 take 1 daily with 11 refills

## 2016-01-06 LAB — CYTOLOGY - PAP

## 2016-03-12 ENCOUNTER — Telehealth: Payer: Self-pay | Admitting: *Deleted

## 2016-03-12 NOTE — Telephone Encounter (Signed)
Pt requesting samples of Loloestrin FE.

## 2016-06-07 ENCOUNTER — Telehealth: Payer: Self-pay | Admitting: *Deleted

## 2016-06-07 NOTE — Telephone Encounter (Signed)
Mailbox full

## 2016-06-12 ENCOUNTER — Telehealth: Payer: Self-pay | Admitting: *Deleted

## 2016-06-15 ENCOUNTER — Other Ambulatory Visit: Payer: Self-pay | Admitting: Advanced Practice Midwife

## 2016-06-15 MED ORDER — NORGESTIMATE-ETH ESTRADIOL 0.25-35 MG-MCG PO TABS: 1.0000 | ORAL_TABLET | Freq: Every day | ORAL | 11 refills | Status: DC

## 2016-06-15 MED ORDER — NORETHIN-ETH ESTRAD-FE BIPHAS 1 MG-10 MCG / 10 MCG PO TABS: 1.0000 | ORAL_TABLET | Freq: Every day | ORAL | 11 refills | Status: DC

## 2016-06-15 NOTE — Telephone Encounter (Signed)
Mailbox full @ 9:22 am. [employer]SY

## 2016-06-15 NOTE — Progress Notes (Unsigned)
Out of samples of COCs, rx for Sptrintec, both rx's called in will decide on price

## 2016-06-18 NOTE — Telephone Encounter (Signed)
Pt aware we have no samples of Lo Loestrin and aware of cost of med. Encounter closed. JSY

## 2016-06-19 ENCOUNTER — Telehealth: Payer: Self-pay | Admitting: Adult Health

## 2016-06-19 NOTE — Telephone Encounter (Signed)
Needs lo loestrin

## 2016-07-31 ENCOUNTER — Telehealth: Payer: Self-pay | Admitting: Adult Health

## 2016-07-31 NOTE — Telephone Encounter (Signed)
Needs samples of OC, do not have any at present

## 2017-01-31 ENCOUNTER — Other Ambulatory Visit: Payer: Self-pay | Admitting: Adult Health

## 2017-10-18 ENCOUNTER — Other Ambulatory Visit: Payer: Self-pay | Admitting: Adult Health

## 2017-10-24 ENCOUNTER — Encounter: Payer: Self-pay | Admitting: Adult Health

## 2017-10-24 ENCOUNTER — Ambulatory Visit (INDEPENDENT_AMBULATORY_CARE_PROVIDER_SITE_OTHER): Payer: Self-pay | Admitting: Adult Health

## 2017-10-24 VITALS — BP 110/64 | HR 80 | Ht 67.5 in | Wt 135.0 lb

## 2017-10-24 DIAGNOSIS — G43009 Migraine without aura, not intractable, without status migrainosus: Secondary | ICD-10-CM

## 2017-10-24 DIAGNOSIS — Z01419 Encounter for gynecological examination (general) (routine) without abnormal findings: Secondary | ICD-10-CM

## 2017-10-24 DIAGNOSIS — Z1212 Encounter for screening for malignant neoplasm of rectum: Secondary | ICD-10-CM

## 2017-10-24 DIAGNOSIS — Z3041 Encounter for surveillance of contraceptive pills: Secondary | ICD-10-CM

## 2017-10-24 DIAGNOSIS — Z1211 Encounter for screening for malignant neoplasm of colon: Secondary | ICD-10-CM

## 2017-10-24 DIAGNOSIS — F418 Other specified anxiety disorders: Secondary | ICD-10-CM

## 2017-10-24 DIAGNOSIS — Z01411 Encounter for gynecological examination (general) (routine) with abnormal findings: Secondary | ICD-10-CM

## 2017-10-24 LAB — HEMOCCULT GUIAC POC 1CARD (OFFICE): FECAL OCCULT BLD: NEGATIVE

## 2017-10-24 MED ORDER — CITALOPRAM HYDROBROMIDE 40 MG PO TABS: 40.0000 mg | ORAL_TABLET | Freq: Every day | ORAL | 11 refills | Status: DC

## 2017-10-24 MED ORDER — IBUPROFEN 800 MG PO TABS: 800.0000 mg | ORAL_TABLET | Freq: Three times a day (TID) | ORAL | 6 refills | Status: DC | PRN

## 2017-10-24 NOTE — Progress Notes (Signed)
Patient ID: [name], female   DOB: 10/13/1971, 46 y.o.   MRN: [id number] History of Present Illness:  [name] is a 46 year old white female, married,G2P2 in for well woman gyn exam, she had normal pap with negative HPV 01/05/16.Pt is self pay and does not have PCP.   Current Medications, Allergies, Past Medical History, Past Surgical History, Family History and Social History were reviewed in [facility] electronic [facility] record.     Review of Systems: Patient denies any daily headaches, hearing loss, fatigue, blurred vision, shortness of breath, chest pain, abdominal pain, problems with bowel movements, urination, or intercourse. No joint pain or mood swings.    Physical Exam:BP 110/64 (BP Location: Left Arm, Patient Position: Sitting, Cuff Size: Normal)   Pulse 80   Ht 5' 7.5" (1.715 m)   Wt 135 lb (61.2 kg)   BMI 20.83 kg/m  General:  Well developed, well nourished, no acute distress Skin:  Warm and dry Neck:  Midline trachea, normal thyroid, good ROM, no lymphadenopathy Lungs; Clear to auscultation bilaterally Breast:  No dominant palpable mass, retraction, or nipple discharge Cardiovascular: Regular rate and rhythm Abdomen:  Soft, non tender, no hepatosplenomegaly Pelvic:  External genitalia is normal in appearance, no lesions.  The vagina is normal in appearance. Urethra has no lesions or masses. The cervix is smooth.  Uterus is felt to be normal size, shape, and contour.  No adnexal masses or tenderness noted.Bladder is non tender, no masses felt. Rectal: Good sphincter tone, no polyps, or hemorrhoids felt.  Hemoccult negative. Extremities/musculoskeletal:  No swelling or varicosities noted, no clubbing or cyanosis Psych:  No mood changes, alert and cooperative,seems happy PHQ 2 score o  Impression: 1. Encounter for well woman exam with routine gynecological exam   2. Encounter for surveillance of contraceptive pills   3. Migraine without aura and without status  migrainosus, not intractable   4. Depression with anxiety   5. Screening for colorectal cancer       Plan: Meds ordered this encounter  Medications  . citalopram (CELEXA) 40 MG tablet    Sig: Take 1 tablet (40 mg total) by mouth daily.    Dispense:  30 tablet    Refill:  11    Please consider 90 day supplies to promote better adherence    Order Specific Question:   Supervising Provider    Answer:   Despina HiddenEURE, LUTHER H [[id number]]  . ibuprofen (ADVIL,MOTRIN) 800 MG tablet    Sig: Take 1 tablet (800 mg total) by mouth every 8 (eight) hours as needed.    Dispense:  30 tablet    Refill:  6    Order Specific Question:   Supervising Provider    Answer:   Lazaro ArmsEURE, LUTHER H [[id number]]   Given 6 packs lo loestrin Mammogram advised to go to health dept Physical in 1 year Pap in 2020

## 2017-11-11 ENCOUNTER — Telehealth: Payer: Self-pay | Admitting: Adult Health

## 2017-11-11 MED ORDER — CEPHALEXIN 500 MG PO CAPS: 500.0000 mg | ORAL_CAPSULE | Freq: Four times a day (QID) | ORAL | 0 refills | Status: DC

## 2017-11-11 NOTE — Telephone Encounter (Signed)
Pt called has right ear pain, no hearing loss, feels achy in neck on that side, has had before, no fever, will Rx Keflex 500 mg 1 qid x 7 days,if not better make appt.

## 2018-02-07 ENCOUNTER — Telehealth: Payer: Self-pay | Admitting: Adult Health

## 2018-02-07 MED ORDER — CEPHALEXIN 500 MG PO CAPS: 500.0000 mg | ORAL_CAPSULE | Freq: Three times a day (TID) | ORAL | 0 refills | Status: DC

## 2018-02-07 NOTE — Telephone Encounter (Signed)
Has sinus infection and sore throat, will rx keflex

## 2018-06-17 ENCOUNTER — Telehealth: Payer: Self-pay | Admitting: Adult Health

## 2018-06-17 NOTE — Telephone Encounter (Signed)
Pt called , wants a call back from Mell[employer] FinancialJennifer G.

## 2018-06-17 NOTE — Telephone Encounter (Signed)
No VM  

## 2018-10-26 ENCOUNTER — Other Ambulatory Visit: Payer: Self-pay | Admitting: Adult Health

## 2018-10-27 ENCOUNTER — Telehealth: Payer: Self-pay | Admitting: Adult Health

## 2018-10-27 NOTE — Telephone Encounter (Signed)
No VM if she calls back, celexa has been refilled

## 2018-10-27 NOTE — Telephone Encounter (Signed)
Patient called, she stated that she needs a refill on Celexa.  She stated she has enough for tomorrow and that's it.    [facility] [place]  [phone]

## 2018-12-31 ENCOUNTER — Telehealth: Payer: Self-pay | Admitting: Adult Health

## 2018-12-31 NOTE — Telephone Encounter (Signed)
Patient called, requested to speak to [facility].  She wouldn't leave any details.  [phone]

## 2018-12-31 NOTE — Telephone Encounter (Signed)
Pt requests samples of lo loestrin, reminded to make appt for pap and physical this year

## 2019-05-20 ENCOUNTER — Other Ambulatory Visit: Payer: Self-pay | Admitting: Adult Health

## 2019-12-19 ENCOUNTER — Other Ambulatory Visit: Payer: Self-pay | Admitting: Adult Health

## 2020-01-13 ENCOUNTER — Other Ambulatory Visit: Payer: Self-pay | Admitting: Adult Health

## 2020-01-18 ENCOUNTER — Other Ambulatory Visit: Payer: Self-pay | Admitting: Adult Health

## 2020-01-20 ENCOUNTER — Ambulatory Visit (INDEPENDENT_AMBULATORY_CARE_PROVIDER_SITE_OTHER): Payer: Self-pay | Admitting: Adult Health

## 2020-01-20 ENCOUNTER — Encounter: Payer: Self-pay | Admitting: Adult Health

## 2020-01-20 ENCOUNTER — Other Ambulatory Visit (HOSPITAL_COMMUNITY)
Admission: RE | Admit: 2020-01-20 | Discharge: 2020-01-20 | Disposition: A | Discharge status: Home / Self Care | Payer: Self-pay | Source: Ambulatory Visit | Attending: Adult Health | Admitting: Adult Health

## 2020-01-20 ENCOUNTER — Other Ambulatory Visit: Payer: Self-pay

## 2020-01-20 VITALS — BP 118/73 | HR 77 | Ht 68.0 in | Wt 130.0 lb

## 2020-01-20 DIAGNOSIS — Z01419 Encounter for gynecological examination (general) (routine) without abnormal findings: Secondary | ICD-10-CM | POA: Insufficient documentation

## 2020-01-20 DIAGNOSIS — N841 Polyp of cervix uteri: Secondary | ICD-10-CM

## 2020-01-20 DIAGNOSIS — Z01411 Encounter for gynecological examination (general) (routine) with abnormal findings: Secondary | ICD-10-CM

## 2020-01-20 DIAGNOSIS — Z3041 Encounter for surveillance of contraceptive pills: Secondary | ICD-10-CM

## 2020-01-20 DIAGNOSIS — Z1212 Encounter for screening for malignant neoplasm of rectum: Secondary | ICD-10-CM

## 2020-01-20 DIAGNOSIS — Z1211 Encounter for screening for malignant neoplasm of colon: Secondary | ICD-10-CM

## 2020-01-20 DIAGNOSIS — F418 Other specified anxiety disorders: Secondary | ICD-10-CM

## 2020-01-20 LAB — HEMOCCULT GUIAC POC 1CARD (OFFICE): Fecal Occult Blood, POC: NEGATIVE

## 2020-01-20 MED ORDER — RIZATRIPTAN BENZOATE 10 MG PO TBDP: 10.0000 mg | ORAL_TABLET | ORAL | 0 refills | Status: DC | PRN

## 2020-01-20 NOTE — Progress Notes (Signed)
Patient ID: [name], female   DOB: 11-17-1970, 49 y.o.   MRN: [id number] History of Present Illness: [name] is a 49 year old white female,married, G2P2, in for a well woman gyn exam and pap.   Current Medications, Allergies, Past Medical History, Past Surgical History, Family History and Social History were reviewed in [employer] record.     Review of Systems: Patient denies any hearing loss, fatigue, blurred vision, shortness of breath, chest pain, abdominal pain, problems with bowel movements, urination, or intercourse(not often). No joint pain or mood swings. +stress with boys at home  No period on OCs. Has more headaches lately and tylenol does not help  Physical Exam:BP 118/73 (BP Location: Left Arm, Patient Position: Sitting, Cuff Size: Normal)   Pulse 77   Ht 5\' 8"  (1.727 m)   Wt 130 lb (59 kg)   BMI 19.77 kg/m  General:  Well developed, well nourished, no acute distress Skin:  Warm and dry Neck:  Midline trachea, normal thyroid, good ROM, no lymphadenopathy Lungs; Clear to auscultation bilaterally Breast:  No dominant palpable mass, retraction, or nipple discharge Cardiovascular: Regular rate and rhythm Abdomen:  Soft, non tender, no hepatosplenomegaly Pelvic:  External genitalia is normal in appearance, no lesions.  The vagina is normal in appearance. Urethra has no lesions or masses. The cervix has 2 cervical polyps at os, removed, by twisting forceps, will send to pathology, pap with GC/CHL and  high risk HPV 16/18 genotyping performed.  Uterus is felt to be normal size, shape, and contour.  No adnexal masses or tenderness noted.Bladder is non tender, no masses felt. Rectal: Good sphincter tone, no polyps, or hemorrhoids felt.  Hemoccult negative. Extremities/musculoskeletal:  No swelling or varicosities noted, no clubbing or cyanosis Psych:  No mood changes, alert and cooperative,seems happy Fall risk is low PHQ 9 score is 4 is on  celexa. She requests Maxalt for headaches has used in the past.  Examination chaperoned by Marland Kitchen CMA  Impression and Plan: 1. Encounter for gynecological examination with Papanicolaou smear of cervix Pap sent  Physical in 1 year Pap in 3 if normal Get mammogram given number for [facility] Meds ordered this encounter  Medications  . rizatriptan (MAXALT-MLT) 10 MG disintegrating tablet    Sig: Take 1 tablet (10 mg total) by mouth as needed for migraine. May repeat in 2 hours if needed    Dispense:  10 tablet    Refill:  0    Order Specific Question:   Supervising Provider    Answer:   Ishmael Holter, LUTHER H [[id number]]    2. Screening for colorectal cancer   3. Endocervical polyp Cervical polyp sent to pathology Follow up in 3 weeks  4. Depression with anxiety Continue celexa has refills  5. Encounter for surveillance of contraceptive pills Continue lo loestrin, 3 packs given

## 2020-01-21 ENCOUNTER — Telehealth: Payer: Self-pay | Admitting: Adult Health

## 2020-01-21 NOTE — Telephone Encounter (Signed)
Pt aware that pathology showed benign endocervical polyp

## 2020-01-25 ENCOUNTER — Telehealth: Payer: Self-pay | Admitting: Adult Health

## 2020-01-25 LAB — CYTOLOGY - PAP
Chlamydia: NEGATIVE
Comment: NEGATIVE
Comment: NEGATIVE
Comment: NORMAL
Diagnosis: NEGATIVE
Diagnosis: REACTIVE
High risk HPV: NEGATIVE
Neisseria Gonorrhea: NEGATIVE

## 2020-01-25 NOTE — Telephone Encounter (Signed)
No VM if calls Pap is negative for malignancy and HPV and GC/CHL

## 2020-02-10 ENCOUNTER — Encounter: Payer: Self-pay | Admitting: Adult Health

## 2020-02-10 ENCOUNTER — Ambulatory Visit (INDEPENDENT_AMBULATORY_CARE_PROVIDER_SITE_OTHER): Payer: Self-pay | Admitting: Adult Health

## 2020-02-10 ENCOUNTER — Other Ambulatory Visit: Payer: Self-pay

## 2020-02-10 VITALS — BP 107/73 | HR 77 | Ht 68.0 in | Wt 127.0 lb

## 2020-02-10 DIAGNOSIS — Z9889 Other specified postprocedural states: Secondary | ICD-10-CM

## 2020-02-10 DIAGNOSIS — Z8742 Personal history of other diseases of the female genital tract: Secondary | ICD-10-CM | POA: Insufficient documentation

## 2020-02-10 NOTE — Progress Notes (Signed)
  Subjective:     Patient ID: [name], female   DOB: [birth year], 49 y.o.   MRN: [id number]  HPI [name] is a 48 year old white female,married, G2P2 back in follow up on on cervical polyp removed 01/20/20 had some bleeding but it has stopped.   Review of Systems No bleeding now Reviewed past medical,surgical, social and family history. Reviewed medications and allergies.     Objective:   Physical Exam BP 107/73 (BP Location: Left Arm, Patient Position: Sitting, Cuff Size: Normal)   Pulse 77   Ht 5\' 8"  (1.727 m)   Wt 127 lb (57.6 kg)   BMI 19.31 kg/m  Skin warm and dry.  Lungs: clear to ausculation bilaterally. Cardiovascular: regular rate and rhythm. Pt aware that polyp benign, no bleeding.    Assessment:     1. History of cervical polypectomy     Plan:     Follow up prn

## 2020-02-15 ENCOUNTER — Other Ambulatory Visit: Payer: Self-pay | Admitting: Adult Health

## 2020-04-09 ENCOUNTER — Other Ambulatory Visit: Payer: Self-pay | Admitting: Adult Health

## 2020-05-06 ENCOUNTER — Other Ambulatory Visit: Payer: Self-pay | Admitting: Adult Health

## 2020-10-02 ENCOUNTER — Other Ambulatory Visit: Payer: Self-pay | Admitting: Adult Health

## 2020-11-30 ENCOUNTER — Other Ambulatory Visit: Payer: Self-pay | Admitting: Adult Health

## 2020-12-19 ENCOUNTER — Telehealth: Payer: Self-pay | Admitting: Adult Health

## 2020-12-19 MED ORDER — NORETHIN-ETH ESTRAD-FE BIPHAS 1 MG-10 MCG / 10 MCG PO TABS: 1.0000 | ORAL_TABLET | Freq: Every day | ORAL | 0 refills | Status: DC

## 2020-12-19 NOTE — Telephone Encounter (Signed)
Will give 6 packs lo loestrin

## 2020-12-19 NOTE — Telephone Encounter (Signed)
Patient called stating that she would like to speak with Victorino Dike. Patient did not state the reason why

## 2020-12-19 NOTE — Addendum Note (Signed)
Addended by: Cyril Mourning A on: 12/19/2020 09:49 AM   Modules accepted: Orders

## 2021-02-05 ENCOUNTER — Other Ambulatory Visit: Payer: Self-pay | Admitting: Adult Health

## 2021-03-13 ENCOUNTER — Other Ambulatory Visit: Payer: Self-pay | Admitting: Adult Health

## 2021-09-03 ENCOUNTER — Other Ambulatory Visit: Payer: Self-pay | Admitting: Adult Health

## 2021-10-17 ENCOUNTER — Other Ambulatory Visit: Payer: Self-pay | Admitting: Adult Health

## 2021-11-23 ENCOUNTER — Ambulatory Visit (INDEPENDENT_AMBULATORY_CARE_PROVIDER_SITE_OTHER): Payer: Self-pay | Admitting: Adult Health

## 2021-11-23 ENCOUNTER — Encounter: Payer: Self-pay | Admitting: Adult Health

## 2021-11-23 ENCOUNTER — Other Ambulatory Visit: Payer: Self-pay

## 2021-11-23 VITALS — BP 109/67 | HR 70 | Ht 68.0 in | Wt 114.4 lb

## 2021-11-23 DIAGNOSIS — F418 Other specified anxiety disorders: Secondary | ICD-10-CM

## 2021-11-23 DIAGNOSIS — Z131 Encounter for screening for diabetes mellitus: Secondary | ICD-10-CM

## 2021-11-23 DIAGNOSIS — K029 Dental caries, unspecified: Secondary | ICD-10-CM

## 2021-11-23 DIAGNOSIS — Z1211 Encounter for screening for malignant neoplasm of colon: Secondary | ICD-10-CM

## 2021-11-23 DIAGNOSIS — Z3041 Encounter for surveillance of contraceptive pills: Secondary | ICD-10-CM

## 2021-11-23 DIAGNOSIS — G43009 Migraine without aura, not intractable, without status migrainosus: Secondary | ICD-10-CM

## 2021-11-23 DIAGNOSIS — Z01419 Encounter for gynecological examination (general) (routine) without abnormal findings: Secondary | ICD-10-CM

## 2021-11-23 LAB — HEMOCCULT GUIAC POC 1CARD (OFFICE): Fecal Occult Blood, POC: NEGATIVE

## 2021-11-23 MED ORDER — CITALOPRAM HYDROBROMIDE 40 MG PO TABS: 40.0000 mg | ORAL_TABLET | Freq: Every day | ORAL | 12 refills | Status: DC

## 2021-11-23 NOTE — Progress Notes (Signed)
Patient ID: [name], female   DOB: August 10, 1971, 51 y.o.   MRN: [id number] History of Present Illness: [name] is a 51 year old white female, married, G2P2 in for a well woman gyn exam. She is active and helps care for father in law. She is self pay.   Lab Results  Component Value Date   DIAGPAP  01/20/2020    - Negative for Intraepithelial Lesions or Malignancy (NILM)   DIAGPAP - Benign reactive/reparative changes 01/20/2020   [place] Negative 01/20/2020    Current Medications, Allergies, Past Medical History, Past Surgical History, Family History and Social History were reviewed in [employer] record.     Review of Systems: Patient denies any daily headaches, )has occasional migraine),hearing loss, fatigue, blurred vision, shortness of breath, chest pain, abdominal pain, problems with bowel movements, urination, or intercourse. No joint pain or mood swings.   She is lost some weight, she says she snacks. No periods on OCs  Physical Exam:BP 109/67 (BP Location: Right Arm, Patient Position: Sitting, Cuff Size: Normal)    Pulse 70    Ht 5\' 8"  (1.727 m)    Wt 114 lb 6.4 oz (51.9 kg)    BMI 17.39 kg/m   General:  Well developed, well nourished, no acute distress Skin:  Warm and dry Neck:  Midline trachea, normal thyroid, good ROM, no lymphadenopathy, has does have dental caries Lungs; Clear to auscultation bilaterally Breast:  No dominant palpable mass, retraction, or nipple discharge Cardiovascular: Regular rate and rhythm Abdomen:  Soft, non tender, no hepatosplenomegaly Pelvic:  External genitalia is normal in appearance, no lesions.  The vagina is normal in appearance. Urethra has no lesions or masses. The cervix is smooth..  Uterus is felt to be normal size, shape, and contour.  No adnexal masses or tenderness noted.Bladder is non tender, no masses felt. Rectal: Good sphincter tone, no polyps, or hemorrhoids felt.  Hemoccult  negative. Extremities/musculoskeletal:  No swelling or varicosities noted, no clubbing or cyanosis Psych:  No mood changes, alert and cooperative,seems happy AA is 0 Fall risk is low Depression screen [facility] 2/9 11/23/2021 01/20/2020 10/24/2017  Decreased Interest 0 0 0  Down, Depressed, Hopeless 0 0 0  PHQ - 2 Score 0 0 0  Altered sleeping 0 3 -  Tired, decreased energy 0 0 -  Change in appetite 0 1 -  Feeling bad or failure about yourself  0 0 -  Trouble concentrating 0 0 -  Moving slowly or fidgety/restless 0 0 -  Suicidal thoughts 0 0 -  PHQ-9 Score 0 4 -  Difficult doing work/chores - Not difficult at all -   On celexa GAD 7 : Generalized Anxiety Score 11/23/2021  Nervous, Anxious, on Edge 0  Control/stop worrying 1  Worry too much - different things 1  Trouble relaxing 0  Restless 0  Easily annoyed or irritable 0  Afraid - awful might happen 0  Total GAD 7 Score 2    Upstream - 11/23/21 0959       Pregnancy Intention Screening   Does the patient want to become pregnant in the next year? No    Does the patient's partner want to become pregnant in the next year? No    Would the patient like to discuss contraceptive options today? No      Contraception Wrap Up   Current Method Oral Contraceptive    End Method Oral Contraceptive    Contraception Counseling Provided No  Examination chaperoned by Celene Squibb LPN   Impression and Plan: 1. Encounter for well woman exam with routine gynecological exam Physical in 1 year Pap in 2024 Gave coupon to get mammogram at [facility] for $99 by 51th birthday  Colonoscopy due later this year  She says she has seen eye doctor  2. Encounter for surveillance of contraceptive pills I gave her 4 packs of lo Loestrin  Will stop OCs at 51 and check [place] when off for a month   3. Encounter for screening fecal occult blood testing  - POCT occult blood stool  4. Depression with anxiety Refilled celexa Meds ordered this  encounter  Medications   citalopram (CELEXA) 40 MG tablet    Sig: Take 1 tablet (40 mg total) by mouth daily.    Dispense:  30 tablet    Refill:  12    Order Specific Question:   Supervising Provider    Answer:   Elonda Husky, LUTHER H [[id number]]     5. Migraine without aura and without status migrainosus, not intractable Has refills on maxalt  6. Screening for diabetes mellitus Will check A1c, has diabetes in her family  - Hemoglobin A1c  7. Caries Encouraged to see dentist, could go to [facility]

## 2021-11-24 LAB — HEMOGLOBIN A1C
Est. average glucose Bld gHb Est-mCnc: 117 mg/dL
Hgb A1c MFr Bld: 5.7 % — ABNORMAL HIGH (ref 4.8–5.6)

## 2022-03-19 ENCOUNTER — Other Ambulatory Visit: Payer: Self-pay | Admitting: Adult Health

## 2022-09-06 ENCOUNTER — Encounter: Payer: Self-pay | Admitting: *Deleted

## 2022-11-14 ENCOUNTER — Other Ambulatory Visit: Payer: Self-pay | Admitting: Adult Health

## 2022-12-10 ENCOUNTER — Other Ambulatory Visit: Payer: Self-pay | Admitting: Adult Health

## 2023-01-24 ENCOUNTER — Telehealth: Payer: Self-pay

## 2023-01-24 NOTE — Telephone Encounter (Signed)
Pt is requesting Lo Loestrin samples. 6 boxes given. Lot # [id number] A exp 05/2024. [place]

## 2023-01-24 NOTE — Telephone Encounter (Signed)
Patient called and stated that she needed to speak to [facility] about getting her medication.

## 2023-05-19 ENCOUNTER — Other Ambulatory Visit: Payer: Self-pay | Admitting: Adult Health

## 2023-07-14 ENCOUNTER — Other Ambulatory Visit: Payer: Self-pay | Admitting: Adult Health

## 2023-08-11 ENCOUNTER — Other Ambulatory Visit: Payer: Self-pay | Admitting: Adult Health

## 2023-08-13 ENCOUNTER — Other Ambulatory Visit: Payer: Self-pay | Admitting: Adult Health

## 2023-11-08 ENCOUNTER — Other Ambulatory Visit: Payer: Self-pay | Admitting: Adult Health

## 2024-02-27 ENCOUNTER — Other Ambulatory Visit: Payer: Self-pay | Admitting: Adult Health

## 2024-03-12 ENCOUNTER — Other Ambulatory Visit: Payer: Self-pay | Admitting: Adult Health

## 2024-04-27 ENCOUNTER — Other Ambulatory Visit: Payer: Self-pay | Admitting: Adult Health

## 2024-05-15 ENCOUNTER — Other Ambulatory Visit: Payer: Self-pay | Admitting: Adult Health

## 2024-11-02 ENCOUNTER — Other Ambulatory Visit (HOSPITAL_COMMUNITY)
Admission: RE | Admit: 2024-11-02 | Discharge: 2024-11-02 | Disposition: A | Discharge status: Home / Self Care | Payer: Self-pay | Source: Ambulatory Visit | Attending: Adult Health | Admitting: Adult Health

## 2024-11-02 ENCOUNTER — Encounter: Payer: Self-pay | Admitting: Adult Health

## 2024-11-02 ENCOUNTER — Ambulatory Visit (INDEPENDENT_AMBULATORY_CARE_PROVIDER_SITE_OTHER): Payer: Self-pay | Admitting: Adult Health

## 2024-11-02 VITALS — BP 133/79 | HR 79 | Ht 68.0 in | Wt 118.0 lb

## 2024-11-02 DIAGNOSIS — Z131 Encounter for screening for diabetes mellitus: Secondary | ICD-10-CM

## 2024-11-02 DIAGNOSIS — Z01419 Encounter for gynecological examination (general) (routine) without abnormal findings: Secondary | ICD-10-CM

## 2024-11-02 DIAGNOSIS — Z1211 Encounter for screening for malignant neoplasm of colon: Secondary | ICD-10-CM

## 2024-11-02 DIAGNOSIS — N912 Amenorrhea, unspecified: Secondary | ICD-10-CM

## 2024-11-02 DIAGNOSIS — Z1231 Encounter for screening mammogram for malignant neoplasm of breast: Secondary | ICD-10-CM

## 2024-11-02 DIAGNOSIS — G43009 Migraine without aura, not intractable, without status migrainosus: Secondary | ICD-10-CM

## 2024-11-02 DIAGNOSIS — F418 Other specified anxiety disorders: Secondary | ICD-10-CM

## 2024-11-02 DIAGNOSIS — Z780 Asymptomatic menopausal state: Secondary | ICD-10-CM

## 2024-11-02 DIAGNOSIS — R209 Unspecified disturbances of skin sensation: Secondary | ICD-10-CM

## 2024-11-02 DIAGNOSIS — Z1329 Encounter for screening for other suspected endocrine disorder: Secondary | ICD-10-CM

## 2024-11-02 DIAGNOSIS — Z13220 Encounter for screening for lipoid disorders: Secondary | ICD-10-CM

## 2024-11-02 DIAGNOSIS — K029 Dental caries, unspecified: Secondary | ICD-10-CM

## 2024-11-02 LAB — HEMOCCULT GUIAC POC 1CARD (OFFICE): Fecal Occult Blood, POC: NEGATIVE

## 2024-11-02 MED ORDER — RIZATRIPTAN BENZOATE 10 MG PO TBDP: ORAL_TABLET | ORAL | 1 refills | Status: AC

## 2024-11-02 MED ORDER — CITALOPRAM HYDROBROMIDE 40 MG PO TABS: 40.0000 mg | ORAL_TABLET | Freq: Every day | ORAL | 6 refills | Status: AC

## 2024-11-02 NOTE — Addendum Note (Signed)
 Addended by: ILEAN RUTHERFORD HERO on: 11/02/2024 10:54 AM   Modules accepted: Orders

## 2024-11-02 NOTE — Progress Notes (Signed)
 Patient ID: [name], female   DOB: 1971-10-30, 53 y.o.   MRN: [id number] History of Present Illness: [name] is a 53 year old white female, married, ?PM in for a well woman gyn exam and pap. Has not had a period in years on lo Loestrin and has stopped them and no bleeding.  She baby sits her grand daughter and cares for FIL.    Current Medications, Allergies, Past Medical History, Past Surgical History, Family History and Social History were reviewed in [employer] record.     Review of Systems: Patient denies any hearing loss, fatigue, blurred vision, shortness of breath, chest pain, abdominal pain, problems with bowel movements, urination, or intercourse. No joint pain or mood swings.  Has migraines at times, and maxalt  helps Has cold hands all the time Denies any vaginal bleeding  Physical Exam:BP 133/79 (BP Location: Left Arm, Patient Position: Sitting, Cuff Size: Normal)   Pulse 79   Ht 5' 8 (1.727 m)   Wt 118 lb (53.5 kg)   LMP 09/23/2012   BMI 17.94 kg/m   General:  Well developed, well nourished, no acute distress, has caries  Skin:  Warm and dry Neck:  Midline trachea, normal thyroid, good ROM, no lymphadenopathy Lungs; Clear to auscultation bilaterally Breast:  No dominant palpable mass, retraction, or nipple discharge Cardiovascular: Regular rate and rhythm Abdomen:  Soft, non tender, no hepatosplenomegaly Pelvic:  External genitalia is normal in appearance, no lesions.  The vagina is normal in appearance. Urethra has no lesions or masses. The cervix is smooth, pap with HR HPV genotyping performed.   Uterus is felt to be normal size, shape, and contour.  No adnexal masses or tenderness noted.Bladder is non tender, no masses felt. Rectal: Good sphincter tone, no polyps, or hemorrhoids felt.  Hemoccult negative. Extremities/musculoskeletal:  No swelling or varicosities noted, no clubbing or cyanosis Psych:  No mood changes, alert and  cooperative,seems happy AA is 0 Fall risk is low    11/02/2024    8:43 AM 11/23/2021    9:59 AM 01/20/2020   11:43 AM  Depression screen PHQ 2/9  Decreased Interest 0 0 0  Down, Depressed, Hopeless 0 0 0  PHQ - 2 Score 0 0 0  Altered sleeping 0 0 3  Tired, decreased energy 0 0 0  Change in appetite 0 0 1  Feeling bad or failure about yourself  0 0 0  Trouble concentrating 0 0 0  Moving slowly or fidgety/restless 0 0 0  Suicidal thoughts 0 0 0  PHQ-9 Score 0 0  4   Difficult doing work/chores   Not difficult at all     Data saved with a previous flowsheet row definition    She is on celexa     11/02/2024    8:43 AM 11/23/2021   10:00 AM  GAD 7 : Generalized Anxiety Score  Nervous, Anxious, on Edge 0 0  Control/stop worrying 0 1  Worry too much - different things 0 1  Trouble relaxing 0 0  Restless 0 0  Easily annoyed or irritable 0 0  Afraid - awful might happen 0 0  Total GAD 7 Score 0 2    Upstream - 11/02/24 9157       Pregnancy Intention Screening   Does the patient want to become pregnant in the next year? No    Does the patient's partner want to become pregnant in the next year? No    Would the patient like to  discuss contraceptive options today? No      Contraception Wrap Up   Current Method Post-Menopause    End Method Post-Menopause    Contraception Counseling Provided No           Examination chaperoned by Clarita Salt LPN  Impression and plan: 1. Encounter for gynecological examination with Papanicolaou smear of cervix (Primary) Pap sent Pap in 3 years if negative Physical in 1 year Try to get PCP Will check labs when fasting, she is self pay - Cytology - PAP( [facility]) - CBC - Comprehensive metabolic panel with GFR - Lipid panel  2. Migraine without aura and without status migrainosus, not intractable Has occasionally and maxalt  helps Meds ordered this encounter  Medications   citalopram  (CELEXA ) 40 MG tablet    Sig: Take 1 tablet (40  mg total) by mouth daily.    Dispense:  30 tablet    Refill:  6    Supervising Provider:   JAYNE MINDER Mccann [[id number]]   rizatriptan  (MAXALT -MLT) 10 MG disintegrating tablet    Sig: Take 1 at start of migraine.May repeat in 2 hours if needed    Dispense:  10 tablet    Refill:  1    Supervising Provider:   JAYNE, LUTHER Mccann [[id number]]     3. Caries Try to see [facility] clinic [facility]  4. Depression with anxiety On celexa  40 mg 1 daily, doing well   5. Encounter for screening fecal occult blood testing Hemoccult was negative  - POCT occult blood stool  6. Cold hands Hands cold all the time, may change color Will check TSH  - TSH  7. Screening cholesterol level  - Lipid panel  8. Screening for diabetes mellitus Has family hx of DM will check A1c - Hemoglobin A1c  9. Screening for thyroid disorder - TSH  10. Screening mammogram for breast cancer Pt to call for appt, she is self pay - MM 3D SCREENING MAMMOGRAM BILATERAL BREAST; Future  11. Menopause No period since stopping Lo Loestrin and none when taking - Follicle stimulating hormone

## 2024-11-04 LAB — CYTOLOGY - PAP
Comment: NEGATIVE
Diagnosis: NEGATIVE
High risk HPV: NEGATIVE

## 2024-11-09 ENCOUNTER — Ambulatory Visit: Payer: Self-pay | Admitting: Adult Health

## 2024-11-12 ENCOUNTER — Encounter (HOSPITAL_COMMUNITY): Payer: Self-pay | Admitting: Adult Health

## 2024-11-12 LAB — LIPID PANEL
Cholesterol: 167 mg/dL
HDL: 50 mg/dL
LDL Cholesterol (Calc): 98 mg/dL
Non-HDL Cholesterol (Calc): 117 mg/dL
Total CHOL/HDL Ratio: 3.3 (calc)
Triglycerides: 98 mg/dL

## 2024-11-12 LAB — CBC
HCT: 41 % (ref 35.9–46.0)
Hemoglobin: 14 g/dL (ref 11.7–15.5)
MCH: 32.7 pg (ref 27.0–33.0)
MCHC: 34.1 g/dL (ref 31.6–35.4)
MCV: 95.8 fL (ref 81.4–101.7)
MPV: 10.4 fL (ref 7.5–12.5)
Platelets: 292 Thousand/uL (ref 140–400)
RBC: 4.28 Million/uL (ref 3.80–5.10)
RDW: 12.4 % (ref 11.0–15.0)
WBC: 5.9 Thousand/uL (ref 3.8–10.8)

## 2024-11-12 LAB — COMPREHENSIVE METABOLIC PANEL WITH GFR
AG Ratio: 2 (calc) (ref 1.0–2.5)
ALT: 15 U/L (ref 6–29)
AST: 19 U/L (ref 10–35)
Albumin: 4.7 g/dL (ref 3.6–5.1)
Alkaline phosphatase (APISO): 77 U/L (ref 37–153)
BUN: 14 mg/dL (ref 7–25)
CO2: 29 mmol/L (ref 20–32)
Calcium: 9.8 mg/dL (ref 8.6–10.4)
Chloride: 103 mmol/L (ref 98–110)
Creat: 0.74 mg/dL (ref 0.50–1.03)
Globulin: 2.4 g/dL (ref 1.9–3.7)
Glucose, Bld: 84 mg/dL (ref 65–99)
Potassium: 4.5 mmol/L (ref 3.5–5.3)
Sodium: 138 mmol/L (ref 135–146)
Total Bilirubin: 0.5 mg/dL (ref 0.2–1.2)
Total Protein: 7.1 g/dL (ref 6.1–8.1)
eGFR: 97 mL/min/1.73m2

## 2024-11-12 LAB — TSH: TSH: 1.79 m[IU]/L

## 2024-11-12 LAB — HEMOGLOBIN A1C
Hgb A1c MFr Bld: 5.6 %
Mean Plasma Glucose: 114 mg/dL
eAG (mmol/L): 6.3 mmol/L

## 2024-11-12 LAB — FOLLICLE STIMULATING HORMONE: FSH: 139.1 m[IU]/mL — ABNORMAL HIGH

## 2024-11-12 NOTE — Telephone Encounter (Signed)
 Pt aware of lab results and voiced understanding. JSY

## 2024-11-12 NOTE — Telephone Encounter (Signed)
-----   Message from Delon Lewis, NP sent at 11/12/2024 12:14 PM EST ----- Will you call [facility] and let her know about labs. THX
